# Patient Record
Sex: Male | Born: 1965 | Race: White | Hispanic: No | Marital: Married | Smoking: Never smoker
Health system: Southern US, Community
[De-identification: ages and names within clinical notes are randomized; demographics above are authoritative.]

## PROBLEM LIST (undated history)

## (undated) DIAGNOSIS — B159 Hepatitis A without hepatic coma: Secondary | ICD-10-CM

## (undated) DIAGNOSIS — M722 Plantar fascial fibromatosis: Secondary | ICD-10-CM

## (undated) HISTORY — PX: OTHER SURGICAL HISTORY: SHX169

## (undated) HISTORY — PX: SHOULDER SURGERY: SHX246

---

## 2008-03-25 ENCOUNTER — Encounter: Admission: RE | Admit: 2008-03-25 | Discharge: 2008-03-25 | Payer: Self-pay | Admitting: Internal Medicine

## 2008-11-25 ENCOUNTER — Encounter: Admission: RE | Admit: 2008-11-25 | Discharge: 2008-11-25 | Payer: Self-pay | Admitting: Chiropractor

## 2009-11-06 ENCOUNTER — Encounter: Admission: RE | Admit: 2009-11-06 | Discharge: 2009-11-06 | Payer: Self-pay | Admitting: Endocrinology

## 2011-09-09 ENCOUNTER — Other Ambulatory Visit: Payer: Self-pay | Admitting: Internal Medicine

## 2011-09-11 ENCOUNTER — Ambulatory Visit
Admission: RE | Admit: 2011-09-11 | Discharge: 2011-09-11 | Disposition: A | Discharge status: Home / Self Care | Payer: BC Managed Care – PPO | Source: Ambulatory Visit | Attending: Internal Medicine | Admitting: Internal Medicine

## 2012-03-20 ENCOUNTER — Ambulatory Visit: Payer: BC Managed Care – PPO | Attending: Neurological Surgery | Admitting: Physical Therapy

## 2012-03-20 DIAGNOSIS — H8110 Benign paroxysmal vertigo, unspecified ear: Secondary | ICD-10-CM | POA: Insufficient documentation

## 2012-03-20 DIAGNOSIS — IMO0001 Reserved for inherently not codable concepts without codable children: Secondary | ICD-10-CM | POA: Insufficient documentation

## 2016-09-16 ENCOUNTER — Ambulatory Visit: Payer: Self-pay | Admitting: Podiatry

## 2019-04-21 DIAGNOSIS — J069 Acute upper respiratory infection, unspecified: Secondary | ICD-10-CM | POA: Diagnosis not present

## 2019-05-04 DIAGNOSIS — M9902 Segmental and somatic dysfunction of thoracic region: Secondary | ICD-10-CM | POA: Diagnosis not present

## 2019-05-04 DIAGNOSIS — M9903 Segmental and somatic dysfunction of lumbar region: Secondary | ICD-10-CM | POA: Diagnosis not present

## 2019-05-04 DIAGNOSIS — M9901 Segmental and somatic dysfunction of cervical region: Secondary | ICD-10-CM | POA: Diagnosis not present

## 2019-05-04 DIAGNOSIS — M5386 Other specified dorsopathies, lumbar region: Secondary | ICD-10-CM | POA: Diagnosis not present

## 2019-05-19 DIAGNOSIS — M9901 Segmental and somatic dysfunction of cervical region: Secondary | ICD-10-CM | POA: Diagnosis not present

## 2019-05-19 DIAGNOSIS — M9902 Segmental and somatic dysfunction of thoracic region: Secondary | ICD-10-CM | POA: Diagnosis not present

## 2019-05-19 DIAGNOSIS — M5386 Other specified dorsopathies, lumbar region: Secondary | ICD-10-CM | POA: Diagnosis not present

## 2019-05-19 DIAGNOSIS — M9903 Segmental and somatic dysfunction of lumbar region: Secondary | ICD-10-CM | POA: Diagnosis not present

## 2019-05-25 DIAGNOSIS — M9901 Segmental and somatic dysfunction of cervical region: Secondary | ICD-10-CM | POA: Diagnosis not present

## 2019-05-25 DIAGNOSIS — M9903 Segmental and somatic dysfunction of lumbar region: Secondary | ICD-10-CM | POA: Diagnosis not present

## 2019-05-25 DIAGNOSIS — M9902 Segmental and somatic dysfunction of thoracic region: Secondary | ICD-10-CM | POA: Diagnosis not present

## 2019-05-25 DIAGNOSIS — M5386 Other specified dorsopathies, lumbar region: Secondary | ICD-10-CM | POA: Diagnosis not present

## 2019-06-08 DIAGNOSIS — M9902 Segmental and somatic dysfunction of thoracic region: Secondary | ICD-10-CM | POA: Diagnosis not present

## 2019-06-08 DIAGNOSIS — M9903 Segmental and somatic dysfunction of lumbar region: Secondary | ICD-10-CM | POA: Diagnosis not present

## 2019-06-08 DIAGNOSIS — M5386 Other specified dorsopathies, lumbar region: Secondary | ICD-10-CM | POA: Diagnosis not present

## 2019-06-08 DIAGNOSIS — M9901 Segmental and somatic dysfunction of cervical region: Secondary | ICD-10-CM | POA: Diagnosis not present

## 2019-07-12 DIAGNOSIS — M9903 Segmental and somatic dysfunction of lumbar region: Secondary | ICD-10-CM | POA: Diagnosis not present

## 2019-07-12 DIAGNOSIS — M5386 Other specified dorsopathies, lumbar region: Secondary | ICD-10-CM | POA: Diagnosis not present

## 2019-07-12 DIAGNOSIS — M9901 Segmental and somatic dysfunction of cervical region: Secondary | ICD-10-CM | POA: Diagnosis not present

## 2019-07-12 DIAGNOSIS — M9902 Segmental and somatic dysfunction of thoracic region: Secondary | ICD-10-CM | POA: Diagnosis not present

## 2019-07-19 DIAGNOSIS — M5386 Other specified dorsopathies, lumbar region: Secondary | ICD-10-CM | POA: Diagnosis not present

## 2019-07-19 DIAGNOSIS — M9901 Segmental and somatic dysfunction of cervical region: Secondary | ICD-10-CM | POA: Diagnosis not present

## 2019-07-19 DIAGNOSIS — M9903 Segmental and somatic dysfunction of lumbar region: Secondary | ICD-10-CM | POA: Diagnosis not present

## 2019-07-19 DIAGNOSIS — M9902 Segmental and somatic dysfunction of thoracic region: Secondary | ICD-10-CM | POA: Diagnosis not present

## 2019-08-02 DIAGNOSIS — M9903 Segmental and somatic dysfunction of lumbar region: Secondary | ICD-10-CM | POA: Diagnosis not present

## 2019-08-02 DIAGNOSIS — M5386 Other specified dorsopathies, lumbar region: Secondary | ICD-10-CM | POA: Diagnosis not present

## 2019-08-02 DIAGNOSIS — M9901 Segmental and somatic dysfunction of cervical region: Secondary | ICD-10-CM | POA: Diagnosis not present

## 2019-08-02 DIAGNOSIS — M9902 Segmental and somatic dysfunction of thoracic region: Secondary | ICD-10-CM | POA: Diagnosis not present

## 2019-08-16 DIAGNOSIS — M5386 Other specified dorsopathies, lumbar region: Secondary | ICD-10-CM | POA: Diagnosis not present

## 2019-08-16 DIAGNOSIS — M9903 Segmental and somatic dysfunction of lumbar region: Secondary | ICD-10-CM | POA: Diagnosis not present

## 2019-08-16 DIAGNOSIS — M9901 Segmental and somatic dysfunction of cervical region: Secondary | ICD-10-CM | POA: Diagnosis not present

## 2019-08-16 DIAGNOSIS — M9902 Segmental and somatic dysfunction of thoracic region: Secondary | ICD-10-CM | POA: Diagnosis not present

## 2019-08-18 DIAGNOSIS — M9902 Segmental and somatic dysfunction of thoracic region: Secondary | ICD-10-CM | POA: Diagnosis not present

## 2019-08-18 DIAGNOSIS — M9901 Segmental and somatic dysfunction of cervical region: Secondary | ICD-10-CM | POA: Diagnosis not present

## 2019-08-18 DIAGNOSIS — M9903 Segmental and somatic dysfunction of lumbar region: Secondary | ICD-10-CM | POA: Diagnosis not present

## 2019-08-18 DIAGNOSIS — M5386 Other specified dorsopathies, lumbar region: Secondary | ICD-10-CM | POA: Diagnosis not present

## 2019-08-23 DIAGNOSIS — M5386 Other specified dorsopathies, lumbar region: Secondary | ICD-10-CM | POA: Diagnosis not present

## 2019-08-23 DIAGNOSIS — M9902 Segmental and somatic dysfunction of thoracic region: Secondary | ICD-10-CM | POA: Diagnosis not present

## 2019-08-23 DIAGNOSIS — M9903 Segmental and somatic dysfunction of lumbar region: Secondary | ICD-10-CM | POA: Diagnosis not present

## 2019-08-23 DIAGNOSIS — M9901 Segmental and somatic dysfunction of cervical region: Secondary | ICD-10-CM | POA: Diagnosis not present

## 2019-08-25 DIAGNOSIS — M9903 Segmental and somatic dysfunction of lumbar region: Secondary | ICD-10-CM | POA: Diagnosis not present

## 2019-08-25 DIAGNOSIS — M5386 Other specified dorsopathies, lumbar region: Secondary | ICD-10-CM | POA: Diagnosis not present

## 2019-08-25 DIAGNOSIS — M9901 Segmental and somatic dysfunction of cervical region: Secondary | ICD-10-CM | POA: Diagnosis not present

## 2019-08-25 DIAGNOSIS — M9902 Segmental and somatic dysfunction of thoracic region: Secondary | ICD-10-CM | POA: Diagnosis not present

## 2019-08-30 DIAGNOSIS — M9902 Segmental and somatic dysfunction of thoracic region: Secondary | ICD-10-CM | POA: Diagnosis not present

## 2019-08-30 DIAGNOSIS — M5386 Other specified dorsopathies, lumbar region: Secondary | ICD-10-CM | POA: Diagnosis not present

## 2019-08-30 DIAGNOSIS — M9903 Segmental and somatic dysfunction of lumbar region: Secondary | ICD-10-CM | POA: Diagnosis not present

## 2019-08-30 DIAGNOSIS — M9901 Segmental and somatic dysfunction of cervical region: Secondary | ICD-10-CM | POA: Diagnosis not present

## 2019-09-01 DIAGNOSIS — M5386 Other specified dorsopathies, lumbar region: Secondary | ICD-10-CM | POA: Diagnosis not present

## 2019-09-01 DIAGNOSIS — M9901 Segmental and somatic dysfunction of cervical region: Secondary | ICD-10-CM | POA: Diagnosis not present

## 2019-09-01 DIAGNOSIS — M9902 Segmental and somatic dysfunction of thoracic region: Secondary | ICD-10-CM | POA: Diagnosis not present

## 2019-09-01 DIAGNOSIS — M9903 Segmental and somatic dysfunction of lumbar region: Secondary | ICD-10-CM | POA: Diagnosis not present

## 2019-09-06 DIAGNOSIS — M9903 Segmental and somatic dysfunction of lumbar region: Secondary | ICD-10-CM | POA: Diagnosis not present

## 2019-09-06 DIAGNOSIS — M9902 Segmental and somatic dysfunction of thoracic region: Secondary | ICD-10-CM | POA: Diagnosis not present

## 2019-09-06 DIAGNOSIS — M5386 Other specified dorsopathies, lumbar region: Secondary | ICD-10-CM | POA: Diagnosis not present

## 2019-09-06 DIAGNOSIS — M9901 Segmental and somatic dysfunction of cervical region: Secondary | ICD-10-CM | POA: Diagnosis not present

## 2019-09-08 DIAGNOSIS — M9901 Segmental and somatic dysfunction of cervical region: Secondary | ICD-10-CM | POA: Diagnosis not present

## 2019-09-08 DIAGNOSIS — M9902 Segmental and somatic dysfunction of thoracic region: Secondary | ICD-10-CM | POA: Diagnosis not present

## 2019-09-08 DIAGNOSIS — M5386 Other specified dorsopathies, lumbar region: Secondary | ICD-10-CM | POA: Diagnosis not present

## 2019-09-08 DIAGNOSIS — M9903 Segmental and somatic dysfunction of lumbar region: Secondary | ICD-10-CM | POA: Diagnosis not present

## 2019-09-13 DIAGNOSIS — M9902 Segmental and somatic dysfunction of thoracic region: Secondary | ICD-10-CM | POA: Diagnosis not present

## 2019-09-13 DIAGNOSIS — M9901 Segmental and somatic dysfunction of cervical region: Secondary | ICD-10-CM | POA: Diagnosis not present

## 2019-09-13 DIAGNOSIS — M5386 Other specified dorsopathies, lumbar region: Secondary | ICD-10-CM | POA: Diagnosis not present

## 2019-09-13 DIAGNOSIS — M9903 Segmental and somatic dysfunction of lumbar region: Secondary | ICD-10-CM | POA: Diagnosis not present

## 2019-09-15 DIAGNOSIS — M9901 Segmental and somatic dysfunction of cervical region: Secondary | ICD-10-CM | POA: Diagnosis not present

## 2019-09-15 DIAGNOSIS — M5386 Other specified dorsopathies, lumbar region: Secondary | ICD-10-CM | POA: Diagnosis not present

## 2019-09-15 DIAGNOSIS — M9902 Segmental and somatic dysfunction of thoracic region: Secondary | ICD-10-CM | POA: Diagnosis not present

## 2019-09-15 DIAGNOSIS — M9903 Segmental and somatic dysfunction of lumbar region: Secondary | ICD-10-CM | POA: Diagnosis not present

## 2019-09-22 DIAGNOSIS — M9902 Segmental and somatic dysfunction of thoracic region: Secondary | ICD-10-CM | POA: Diagnosis not present

## 2019-09-22 DIAGNOSIS — M9903 Segmental and somatic dysfunction of lumbar region: Secondary | ICD-10-CM | POA: Diagnosis not present

## 2019-09-22 DIAGNOSIS — M5386 Other specified dorsopathies, lumbar region: Secondary | ICD-10-CM | POA: Diagnosis not present

## 2019-09-22 DIAGNOSIS — M9901 Segmental and somatic dysfunction of cervical region: Secondary | ICD-10-CM | POA: Diagnosis not present

## 2019-09-29 DIAGNOSIS — M9902 Segmental and somatic dysfunction of thoracic region: Secondary | ICD-10-CM | POA: Diagnosis not present

## 2019-09-29 DIAGNOSIS — M9903 Segmental and somatic dysfunction of lumbar region: Secondary | ICD-10-CM | POA: Diagnosis not present

## 2019-09-29 DIAGNOSIS — M5386 Other specified dorsopathies, lumbar region: Secondary | ICD-10-CM | POA: Diagnosis not present

## 2019-09-29 DIAGNOSIS — M9901 Segmental and somatic dysfunction of cervical region: Secondary | ICD-10-CM | POA: Diagnosis not present

## 2019-10-06 DIAGNOSIS — M9901 Segmental and somatic dysfunction of cervical region: Secondary | ICD-10-CM | POA: Diagnosis not present

## 2019-10-06 DIAGNOSIS — M9902 Segmental and somatic dysfunction of thoracic region: Secondary | ICD-10-CM | POA: Diagnosis not present

## 2019-10-06 DIAGNOSIS — M9903 Segmental and somatic dysfunction of lumbar region: Secondary | ICD-10-CM | POA: Diagnosis not present

## 2019-10-06 DIAGNOSIS — M5386 Other specified dorsopathies, lumbar region: Secondary | ICD-10-CM | POA: Diagnosis not present

## 2019-10-13 DIAGNOSIS — M9901 Segmental and somatic dysfunction of cervical region: Secondary | ICD-10-CM | POA: Diagnosis not present

## 2019-10-13 DIAGNOSIS — M9902 Segmental and somatic dysfunction of thoracic region: Secondary | ICD-10-CM | POA: Diagnosis not present

## 2019-10-13 DIAGNOSIS — M9903 Segmental and somatic dysfunction of lumbar region: Secondary | ICD-10-CM | POA: Diagnosis not present

## 2019-10-13 DIAGNOSIS — M5386 Other specified dorsopathies, lumbar region: Secondary | ICD-10-CM | POA: Diagnosis not present

## 2019-10-20 DIAGNOSIS — M9903 Segmental and somatic dysfunction of lumbar region: Secondary | ICD-10-CM | POA: Diagnosis not present

## 2019-10-20 DIAGNOSIS — M9901 Segmental and somatic dysfunction of cervical region: Secondary | ICD-10-CM | POA: Diagnosis not present

## 2019-10-20 DIAGNOSIS — M9902 Segmental and somatic dysfunction of thoracic region: Secondary | ICD-10-CM | POA: Diagnosis not present

## 2019-10-20 DIAGNOSIS — M5386 Other specified dorsopathies, lumbar region: Secondary | ICD-10-CM | POA: Diagnosis not present

## 2019-10-27 DIAGNOSIS — M9902 Segmental and somatic dysfunction of thoracic region: Secondary | ICD-10-CM | POA: Diagnosis not present

## 2019-10-27 DIAGNOSIS — M5386 Other specified dorsopathies, lumbar region: Secondary | ICD-10-CM | POA: Diagnosis not present

## 2019-10-27 DIAGNOSIS — M9903 Segmental and somatic dysfunction of lumbar region: Secondary | ICD-10-CM | POA: Diagnosis not present

## 2019-10-27 DIAGNOSIS — M9901 Segmental and somatic dysfunction of cervical region: Secondary | ICD-10-CM | POA: Diagnosis not present

## 2019-11-17 DIAGNOSIS — M9903 Segmental and somatic dysfunction of lumbar region: Secondary | ICD-10-CM | POA: Diagnosis not present

## 2019-11-17 DIAGNOSIS — M9902 Segmental and somatic dysfunction of thoracic region: Secondary | ICD-10-CM | POA: Diagnosis not present

## 2019-11-17 DIAGNOSIS — M5386 Other specified dorsopathies, lumbar region: Secondary | ICD-10-CM | POA: Diagnosis not present

## 2019-11-17 DIAGNOSIS — M9901 Segmental and somatic dysfunction of cervical region: Secondary | ICD-10-CM | POA: Diagnosis not present

## 2019-11-24 DIAGNOSIS — M5386 Other specified dorsopathies, lumbar region: Secondary | ICD-10-CM | POA: Diagnosis not present

## 2019-11-24 DIAGNOSIS — M9901 Segmental and somatic dysfunction of cervical region: Secondary | ICD-10-CM | POA: Diagnosis not present

## 2019-11-24 DIAGNOSIS — M9902 Segmental and somatic dysfunction of thoracic region: Secondary | ICD-10-CM | POA: Diagnosis not present

## 2019-11-24 DIAGNOSIS — M9903 Segmental and somatic dysfunction of lumbar region: Secondary | ICD-10-CM | POA: Diagnosis not present

## 2019-12-01 DIAGNOSIS — M9902 Segmental and somatic dysfunction of thoracic region: Secondary | ICD-10-CM | POA: Diagnosis not present

## 2019-12-01 DIAGNOSIS — M9903 Segmental and somatic dysfunction of lumbar region: Secondary | ICD-10-CM | POA: Diagnosis not present

## 2019-12-01 DIAGNOSIS — M9901 Segmental and somatic dysfunction of cervical region: Secondary | ICD-10-CM | POA: Diagnosis not present

## 2019-12-01 DIAGNOSIS — M5386 Other specified dorsopathies, lumbar region: Secondary | ICD-10-CM | POA: Diagnosis not present

## 2019-12-13 DIAGNOSIS — M9901 Segmental and somatic dysfunction of cervical region: Secondary | ICD-10-CM | POA: Diagnosis not present

## 2019-12-13 DIAGNOSIS — M9902 Segmental and somatic dysfunction of thoracic region: Secondary | ICD-10-CM | POA: Diagnosis not present

## 2019-12-13 DIAGNOSIS — M9903 Segmental and somatic dysfunction of lumbar region: Secondary | ICD-10-CM | POA: Diagnosis not present

## 2019-12-13 DIAGNOSIS — M5386 Other specified dorsopathies, lumbar region: Secondary | ICD-10-CM | POA: Diagnosis not present

## 2019-12-27 DIAGNOSIS — M9902 Segmental and somatic dysfunction of thoracic region: Secondary | ICD-10-CM | POA: Diagnosis not present

## 2019-12-27 DIAGNOSIS — M9901 Segmental and somatic dysfunction of cervical region: Secondary | ICD-10-CM | POA: Diagnosis not present

## 2019-12-27 DIAGNOSIS — M5386 Other specified dorsopathies, lumbar region: Secondary | ICD-10-CM | POA: Diagnosis not present

## 2019-12-27 DIAGNOSIS — M9903 Segmental and somatic dysfunction of lumbar region: Secondary | ICD-10-CM | POA: Diagnosis not present

## 2019-12-30 DIAGNOSIS — Z0000 Encounter for general adult medical examination without abnormal findings: Secondary | ICD-10-CM | POA: Diagnosis not present

## 2019-12-30 DIAGNOSIS — Z8052 Family history of malignant neoplasm of bladder: Secondary | ICD-10-CM | POA: Diagnosis not present

## 2020-01-04 DIAGNOSIS — E291 Testicular hypofunction: Secondary | ICD-10-CM | POA: Diagnosis not present

## 2020-01-04 DIAGNOSIS — E7800 Pure hypercholesterolemia, unspecified: Secondary | ICD-10-CM | POA: Diagnosis not present

## 2020-01-04 DIAGNOSIS — N529 Male erectile dysfunction, unspecified: Secondary | ICD-10-CM | POA: Diagnosis not present

## 2020-01-04 DIAGNOSIS — M2540 Effusion, unspecified joint: Secondary | ICD-10-CM | POA: Diagnosis not present

## 2020-01-04 DIAGNOSIS — E559 Vitamin D deficiency, unspecified: Secondary | ICD-10-CM | POA: Diagnosis not present

## 2020-01-04 DIAGNOSIS — Z23 Encounter for immunization: Secondary | ICD-10-CM | POA: Diagnosis not present

## 2020-01-11 DIAGNOSIS — M5386 Other specified dorsopathies, lumbar region: Secondary | ICD-10-CM | POA: Diagnosis not present

## 2020-01-11 DIAGNOSIS — M9903 Segmental and somatic dysfunction of lumbar region: Secondary | ICD-10-CM | POA: Diagnosis not present

## 2020-01-11 DIAGNOSIS — M9902 Segmental and somatic dysfunction of thoracic region: Secondary | ICD-10-CM | POA: Diagnosis not present

## 2020-01-11 DIAGNOSIS — M9901 Segmental and somatic dysfunction of cervical region: Secondary | ICD-10-CM | POA: Diagnosis not present

## 2020-02-02 DIAGNOSIS — M5386 Other specified dorsopathies, lumbar region: Secondary | ICD-10-CM | POA: Diagnosis not present

## 2020-02-02 DIAGNOSIS — M9902 Segmental and somatic dysfunction of thoracic region: Secondary | ICD-10-CM | POA: Diagnosis not present

## 2020-02-02 DIAGNOSIS — M9903 Segmental and somatic dysfunction of lumbar region: Secondary | ICD-10-CM | POA: Diagnosis not present

## 2020-02-02 DIAGNOSIS — M9901 Segmental and somatic dysfunction of cervical region: Secondary | ICD-10-CM | POA: Diagnosis not present

## 2020-02-09 DIAGNOSIS — M5386 Other specified dorsopathies, lumbar region: Secondary | ICD-10-CM | POA: Diagnosis not present

## 2020-02-09 DIAGNOSIS — M9903 Segmental and somatic dysfunction of lumbar region: Secondary | ICD-10-CM | POA: Diagnosis not present

## 2020-02-09 DIAGNOSIS — M9901 Segmental and somatic dysfunction of cervical region: Secondary | ICD-10-CM | POA: Diagnosis not present

## 2020-02-09 DIAGNOSIS — M9902 Segmental and somatic dysfunction of thoracic region: Secondary | ICD-10-CM | POA: Diagnosis not present

## 2020-05-24 DIAGNOSIS — M9901 Segmental and somatic dysfunction of cervical region: Secondary | ICD-10-CM | POA: Diagnosis not present

## 2020-05-24 DIAGNOSIS — R519 Headache, unspecified: Secondary | ICD-10-CM | POA: Diagnosis not present

## 2020-05-24 DIAGNOSIS — M531 Cervicobrachial syndrome: Secondary | ICD-10-CM | POA: Diagnosis not present

## 2020-05-24 DIAGNOSIS — M5386 Other specified dorsopathies, lumbar region: Secondary | ICD-10-CM | POA: Diagnosis not present

## 2020-05-31 DIAGNOSIS — M9901 Segmental and somatic dysfunction of cervical region: Secondary | ICD-10-CM | POA: Diagnosis not present

## 2020-05-31 DIAGNOSIS — M531 Cervicobrachial syndrome: Secondary | ICD-10-CM | POA: Diagnosis not present

## 2020-05-31 DIAGNOSIS — R519 Headache, unspecified: Secondary | ICD-10-CM | POA: Diagnosis not present

## 2020-05-31 DIAGNOSIS — M5386 Other specified dorsopathies, lumbar region: Secondary | ICD-10-CM | POA: Diagnosis not present

## 2020-06-07 DIAGNOSIS — R519 Headache, unspecified: Secondary | ICD-10-CM | POA: Diagnosis not present

## 2020-06-07 DIAGNOSIS — M9901 Segmental and somatic dysfunction of cervical region: Secondary | ICD-10-CM | POA: Diagnosis not present

## 2020-06-07 DIAGNOSIS — M531 Cervicobrachial syndrome: Secondary | ICD-10-CM | POA: Diagnosis not present

## 2020-06-07 DIAGNOSIS — M5386 Other specified dorsopathies, lumbar region: Secondary | ICD-10-CM | POA: Diagnosis not present

## 2020-06-14 DIAGNOSIS — M9901 Segmental and somatic dysfunction of cervical region: Secondary | ICD-10-CM | POA: Diagnosis not present

## 2020-06-14 DIAGNOSIS — M5386 Other specified dorsopathies, lumbar region: Secondary | ICD-10-CM | POA: Diagnosis not present

## 2020-06-14 DIAGNOSIS — R519 Headache, unspecified: Secondary | ICD-10-CM | POA: Diagnosis not present

## 2020-06-14 DIAGNOSIS — M531 Cervicobrachial syndrome: Secondary | ICD-10-CM | POA: Diagnosis not present

## 2020-06-28 DIAGNOSIS — R519 Headache, unspecified: Secondary | ICD-10-CM | POA: Diagnosis not present

## 2020-06-28 DIAGNOSIS — M5386 Other specified dorsopathies, lumbar region: Secondary | ICD-10-CM | POA: Diagnosis not present

## 2020-06-28 DIAGNOSIS — M531 Cervicobrachial syndrome: Secondary | ICD-10-CM | POA: Diagnosis not present

## 2020-06-28 DIAGNOSIS — M9901 Segmental and somatic dysfunction of cervical region: Secondary | ICD-10-CM | POA: Diagnosis not present

## 2020-07-12 DIAGNOSIS — M9901 Segmental and somatic dysfunction of cervical region: Secondary | ICD-10-CM | POA: Diagnosis not present

## 2020-07-12 DIAGNOSIS — R519 Headache, unspecified: Secondary | ICD-10-CM | POA: Diagnosis not present

## 2020-07-12 DIAGNOSIS — M5386 Other specified dorsopathies, lumbar region: Secondary | ICD-10-CM | POA: Diagnosis not present

## 2020-07-12 DIAGNOSIS — M531 Cervicobrachial syndrome: Secondary | ICD-10-CM | POA: Diagnosis not present

## 2020-07-26 DIAGNOSIS — M5386 Other specified dorsopathies, lumbar region: Secondary | ICD-10-CM | POA: Diagnosis not present

## 2020-07-26 DIAGNOSIS — M531 Cervicobrachial syndrome: Secondary | ICD-10-CM | POA: Diagnosis not present

## 2020-07-26 DIAGNOSIS — R519 Headache, unspecified: Secondary | ICD-10-CM | POA: Diagnosis not present

## 2020-07-26 DIAGNOSIS — M9901 Segmental and somatic dysfunction of cervical region: Secondary | ICD-10-CM | POA: Diagnosis not present

## 2020-08-15 DIAGNOSIS — M531 Cervicobrachial syndrome: Secondary | ICD-10-CM | POA: Diagnosis not present

## 2020-08-15 DIAGNOSIS — R519 Headache, unspecified: Secondary | ICD-10-CM | POA: Diagnosis not present

## 2020-08-15 DIAGNOSIS — M5386 Other specified dorsopathies, lumbar region: Secondary | ICD-10-CM | POA: Diagnosis not present

## 2020-08-15 DIAGNOSIS — M9901 Segmental and somatic dysfunction of cervical region: Secondary | ICD-10-CM | POA: Diagnosis not present

## 2020-08-30 DIAGNOSIS — M9901 Segmental and somatic dysfunction of cervical region: Secondary | ICD-10-CM | POA: Diagnosis not present

## 2020-08-30 DIAGNOSIS — R519 Headache, unspecified: Secondary | ICD-10-CM | POA: Diagnosis not present

## 2020-08-30 DIAGNOSIS — M5386 Other specified dorsopathies, lumbar region: Secondary | ICD-10-CM | POA: Diagnosis not present

## 2020-08-30 DIAGNOSIS — M531 Cervicobrachial syndrome: Secondary | ICD-10-CM | POA: Diagnosis not present

## 2020-09-20 DIAGNOSIS — M62830 Muscle spasm of back: Secondary | ICD-10-CM | POA: Diagnosis not present

## 2020-09-20 DIAGNOSIS — M9903 Segmental and somatic dysfunction of lumbar region: Secondary | ICD-10-CM | POA: Diagnosis not present

## 2020-09-20 DIAGNOSIS — M9901 Segmental and somatic dysfunction of cervical region: Secondary | ICD-10-CM | POA: Diagnosis not present

## 2020-09-20 DIAGNOSIS — M545 Low back pain, unspecified: Secondary | ICD-10-CM | POA: Diagnosis not present

## 2020-09-26 DIAGNOSIS — M9901 Segmental and somatic dysfunction of cervical region: Secondary | ICD-10-CM | POA: Diagnosis not present

## 2020-09-26 DIAGNOSIS — M9903 Segmental and somatic dysfunction of lumbar region: Secondary | ICD-10-CM | POA: Diagnosis not present

## 2020-09-26 DIAGNOSIS — M62830 Muscle spasm of back: Secondary | ICD-10-CM | POA: Diagnosis not present

## 2020-09-26 DIAGNOSIS — M545 Low back pain, unspecified: Secondary | ICD-10-CM | POA: Diagnosis not present

## 2020-10-05 DIAGNOSIS — M62830 Muscle spasm of back: Secondary | ICD-10-CM | POA: Diagnosis not present

## 2020-10-05 DIAGNOSIS — M9903 Segmental and somatic dysfunction of lumbar region: Secondary | ICD-10-CM | POA: Diagnosis not present

## 2020-10-05 DIAGNOSIS — M545 Low back pain, unspecified: Secondary | ICD-10-CM | POA: Diagnosis not present

## 2020-10-05 DIAGNOSIS — M9901 Segmental and somatic dysfunction of cervical region: Secondary | ICD-10-CM | POA: Diagnosis not present

## 2020-10-09 DIAGNOSIS — M9903 Segmental and somatic dysfunction of lumbar region: Secondary | ICD-10-CM | POA: Diagnosis not present

## 2020-10-09 DIAGNOSIS — M9901 Segmental and somatic dysfunction of cervical region: Secondary | ICD-10-CM | POA: Diagnosis not present

## 2020-10-09 DIAGNOSIS — M62830 Muscle spasm of back: Secondary | ICD-10-CM | POA: Diagnosis not present

## 2020-10-09 DIAGNOSIS — M545 Low back pain, unspecified: Secondary | ICD-10-CM | POA: Diagnosis not present

## 2020-10-11 DIAGNOSIS — M9901 Segmental and somatic dysfunction of cervical region: Secondary | ICD-10-CM | POA: Diagnosis not present

## 2020-10-11 DIAGNOSIS — M62830 Muscle spasm of back: Secondary | ICD-10-CM | POA: Diagnosis not present

## 2020-10-11 DIAGNOSIS — M9903 Segmental and somatic dysfunction of lumbar region: Secondary | ICD-10-CM | POA: Diagnosis not present

## 2020-10-11 DIAGNOSIS — M545 Low back pain, unspecified: Secondary | ICD-10-CM | POA: Diagnosis not present

## 2020-10-16 DIAGNOSIS — M9901 Segmental and somatic dysfunction of cervical region: Secondary | ICD-10-CM | POA: Diagnosis not present

## 2020-10-16 DIAGNOSIS — M62830 Muscle spasm of back: Secondary | ICD-10-CM | POA: Diagnosis not present

## 2020-10-16 DIAGNOSIS — M9903 Segmental and somatic dysfunction of lumbar region: Secondary | ICD-10-CM | POA: Diagnosis not present

## 2020-10-16 DIAGNOSIS — M545 Low back pain, unspecified: Secondary | ICD-10-CM | POA: Diagnosis not present

## 2020-10-18 DIAGNOSIS — M9903 Segmental and somatic dysfunction of lumbar region: Secondary | ICD-10-CM | POA: Diagnosis not present

## 2020-10-18 DIAGNOSIS — M62830 Muscle spasm of back: Secondary | ICD-10-CM | POA: Diagnosis not present

## 2020-10-18 DIAGNOSIS — M9901 Segmental and somatic dysfunction of cervical region: Secondary | ICD-10-CM | POA: Diagnosis not present

## 2020-10-18 DIAGNOSIS — M545 Low back pain, unspecified: Secondary | ICD-10-CM | POA: Diagnosis not present

## 2020-10-19 DIAGNOSIS — M9901 Segmental and somatic dysfunction of cervical region: Secondary | ICD-10-CM | POA: Diagnosis not present

## 2020-10-19 DIAGNOSIS — M545 Low back pain, unspecified: Secondary | ICD-10-CM | POA: Diagnosis not present

## 2020-10-19 DIAGNOSIS — M62830 Muscle spasm of back: Secondary | ICD-10-CM | POA: Diagnosis not present

## 2020-10-19 DIAGNOSIS — M9903 Segmental and somatic dysfunction of lumbar region: Secondary | ICD-10-CM | POA: Diagnosis not present

## 2020-10-23 DIAGNOSIS — M9901 Segmental and somatic dysfunction of cervical region: Secondary | ICD-10-CM | POA: Diagnosis not present

## 2020-10-23 DIAGNOSIS — M62830 Muscle spasm of back: Secondary | ICD-10-CM | POA: Diagnosis not present

## 2020-10-23 DIAGNOSIS — M545 Low back pain, unspecified: Secondary | ICD-10-CM | POA: Diagnosis not present

## 2020-10-23 DIAGNOSIS — M9903 Segmental and somatic dysfunction of lumbar region: Secondary | ICD-10-CM | POA: Diagnosis not present

## 2020-10-25 DIAGNOSIS — M9901 Segmental and somatic dysfunction of cervical region: Secondary | ICD-10-CM | POA: Diagnosis not present

## 2020-10-25 DIAGNOSIS — M62838 Other muscle spasm: Secondary | ICD-10-CM | POA: Diagnosis not present

## 2020-10-25 DIAGNOSIS — M9903 Segmental and somatic dysfunction of lumbar region: Secondary | ICD-10-CM | POA: Diagnosis not present

## 2020-10-25 DIAGNOSIS — M62830 Muscle spasm of back: Secondary | ICD-10-CM | POA: Diagnosis not present

## 2020-10-25 DIAGNOSIS — M545 Low back pain, unspecified: Secondary | ICD-10-CM | POA: Diagnosis not present

## 2021-03-29 DIAGNOSIS — H66003 Acute suppurative otitis media without spontaneous rupture of ear drum, bilateral: Secondary | ICD-10-CM | POA: Diagnosis not present

## 2021-04-05 DIAGNOSIS — H6506 Acute serous otitis media, recurrent, bilateral: Secondary | ICD-10-CM | POA: Diagnosis not present

## 2021-05-03 ENCOUNTER — Other Ambulatory Visit: Payer: Self-pay

## 2021-05-03 ENCOUNTER — Emergency Department (HOSPITAL_COMMUNITY): Payer: BC Managed Care – PPO

## 2021-05-03 ENCOUNTER — Encounter (HOSPITAL_COMMUNITY): Payer: Self-pay

## 2021-05-03 ENCOUNTER — Emergency Department (HOSPITAL_COMMUNITY)
Admission: EM | Admit: 2021-05-03 | Discharge: 2021-05-03 | Disposition: A | Discharge status: Home / Self Care | Payer: BC Managed Care – PPO | Attending: Emergency Medicine | Admitting: Emergency Medicine

## 2021-05-03 DIAGNOSIS — R531 Weakness: Secondary | ICD-10-CM | POA: Diagnosis not present

## 2021-05-03 DIAGNOSIS — R000 Tachycardia, unspecified: Secondary | ICD-10-CM | POA: Diagnosis not present

## 2021-05-03 DIAGNOSIS — R930 Abnormal findings on diagnostic imaging of skull and head, not elsewhere classified: Secondary | ICD-10-CM

## 2021-05-03 DIAGNOSIS — M5090 Cervical disc disorder, unspecified, unspecified cervical region: Secondary | ICD-10-CM

## 2021-05-03 DIAGNOSIS — R202 Paresthesia of skin: Secondary | ICD-10-CM | POA: Insufficient documentation

## 2021-05-03 DIAGNOSIS — R42 Dizziness and giddiness: Secondary | ICD-10-CM | POA: Diagnosis not present

## 2021-05-03 DIAGNOSIS — I6782 Cerebral ischemia: Secondary | ICD-10-CM | POA: Diagnosis not present

## 2021-05-03 DIAGNOSIS — M4312 Spondylolisthesis, cervical region: Secondary | ICD-10-CM | POA: Diagnosis not present

## 2021-05-03 DIAGNOSIS — M40204 Unspecified kyphosis, thoracic region: Secondary | ICD-10-CM | POA: Diagnosis not present

## 2021-05-03 DIAGNOSIS — I629 Nontraumatic intracranial hemorrhage, unspecified: Secondary | ICD-10-CM | POA: Diagnosis not present

## 2021-05-03 HISTORY — DX: Plantar fascial fibromatosis: M72.2

## 2021-05-03 HISTORY — DX: Hepatitis a without hepatic coma: B15.9

## 2021-05-03 LAB — CBC WITH DIFFERENTIAL/PLATELET
Abs Immature Granulocytes: 0.04 10*3/uL (ref 0.00–0.07)
Basophils Absolute: 0.1 10*3/uL (ref 0.0–0.1)
Basophils Relative: 1 %
Eosinophils Absolute: 0.1 10*3/uL (ref 0.0–0.5)
Eosinophils Relative: 1 %
HCT: 50.2 % (ref 39.0–52.0)
Hemoglobin: 17.7 g/dL — ABNORMAL HIGH (ref 13.0–17.0)
Immature Granulocytes: 0 %
Lymphocytes Relative: 16 %
Lymphs Abs: 1.6 10*3/uL (ref 0.7–4.0)
MCH: 32.1 pg (ref 26.0–34.0)
MCHC: 35.3 g/dL (ref 30.0–36.0)
MCV: 91.1 fL (ref 80.0–100.0)
Monocytes Absolute: 0.7 10*3/uL (ref 0.1–1.0)
Monocytes Relative: 7 %
Neutro Abs: 7.3 10*3/uL (ref 1.7–7.7)
Neutrophils Relative %: 75 %
Platelets: 229 10*3/uL (ref 150–400)
RBC: 5.51 MIL/uL (ref 4.22–5.81)
RDW: 12.7 % (ref 11.5–15.5)
WBC: 9.7 10*3/uL (ref 4.0–10.5)
nRBC: 0 % (ref 0.0–0.2)

## 2021-05-03 LAB — COMPREHENSIVE METABOLIC PANEL
ALT: 29 U/L (ref 0–44)
AST: 26 U/L (ref 15–41)
Albumin: 4.2 g/dL (ref 3.5–5.0)
Alkaline Phosphatase: 51 U/L (ref 38–126)
Anion gap: 13 (ref 5–15)
BUN: 10 mg/dL (ref 6–20)
CO2: 21 mmol/L — ABNORMAL LOW (ref 22–32)
Calcium: 9.2 mg/dL (ref 8.9–10.3)
Chloride: 102 mmol/L (ref 98–111)
Creatinine, Ser: 1.17 mg/dL (ref 0.61–1.24)
GFR, Estimated: >60 mL/min (ref >60)
Glucose, Bld: 132 mg/dL — ABNORMAL HIGH (ref 70–99)
Potassium: 4.1 mmol/L (ref 3.5–5.1)
Sodium: 136 mmol/L (ref 135–145)
Total Bilirubin: 1 mg/dL (ref 0.3–1.2)
Total Protein: 7.2 g/dL (ref 6.5–8.1)

## 2021-05-03 LAB — URINALYSIS, ROUTINE W REFLEX MICROSCOPIC
Bilirubin Urine: NEGATIVE
Glucose, UA: NEGATIVE mg/dL
Hgb urine dipstick: NEGATIVE
Ketones, ur: NEGATIVE mg/dL
Leukocytes,Ua: NEGATIVE
Nitrite: NEGATIVE
Protein, ur: NEGATIVE mg/dL
Specific Gravity, Urine: 1.018 (ref 1.005–1.030)
pH: 5 (ref 5.0–8.0)

## 2021-05-03 LAB — APTT: aPTT: 27 seconds (ref 24–36)

## 2021-05-03 LAB — PROTIME-INR
INR: 0.9 (ref 0.8–1.2)
Prothrombin Time: 12.4 seconds (ref 11.4–15.2)

## 2021-05-03 NOTE — ED Provider Triage Note (Signed)
Emergency Medicine Provider Triage Evaluation Note ? ?Adam Levine , a 56 y.o. male  was evaluated in triage.  Pt complains of weakness to his right hand. He reports he was sitting at a coffee shop when he found it difficult to hold the coffee cup. He reports he felt lightheaded and then he noticed the weakness in his left arm as well. Denies any numbness or tingling. Denies any headache or blurry vision. He reports a history of syncopal episode in the past. Denies any chest pain or SOB. Denies any trouble with his speech or walking. The episdoe went from 1245 to 1330. No feels at baseline. Her reports he took two 325mg  of ASA. ? ?Review of Systems  ?Positive:  ?Negative:  ? ?Physical Exam  ?BP (!) 167/87 (BP Location: Right Arm)   Pulse (!) 112   Temp 98.8 ?F (37.1 ?C) (Oral)   Resp 16   SpO2 95%  ?Gen:   Awake, no distress   ?Resp:  Normal effort  ?MSK:   Moves extremities without difficulty  ?Other:  Cranial nerves II-XII. Finger to nose intact. Normal speech answering questions appropriately. Strength 5/5 in upper and lower extremities bilaterally.  ? ?Medical Decision Making  ?Medically screening exam initiated at 2:51 PM.  Appropriate orders placed.  Chet Chaddock was informed that the remainder of the evaluation will be completed by another provider, this initial triage assessment does not replace that evaluation, and the importance of remaining in the ED until their evaluation is complete. ? ?Will order head CT and basic labs.  ?  ?Cristal Deer, PA-C ?05/03/21 1457 ? ?

## 2021-05-03 NOTE — ED Provider Notes (Signed)
?[facility] ?Provider Note ? ? ?CSN: [id number] ?Arrival date & time: 05/03/21  1340 ? ?  ? ?History ? ?Chief Complaint  ?Patient presents with  ? weakness in hands  ? ? ?Adam Levine is a 56 y.o. male. ? ?56 year old male presents with cute onset of bilateral hand paresthesias and weakness which began prior to arrival.  Symptoms lasted for approximately 40 minutes have since resolved.  Slight dizziness but no severe headache.  No visual changes.  No gait disturbance.  No change in his speech.  Denies any confusion.  No prior history of same.  No treatment use prior to arrival ? ? ?  ? ?Home Medications ?Prior to Admission medications   ?Not on File  ?   ? ?Allergies    ?Patient has no known allergies.   ? ?Review of Systems   ?Review of Systems  ?All other systems reviewed and are negative. ? ?Physical Exam ?Updated Vital Signs ?BP (!) 172/84   Pulse 99   Temp 98.8 ?F (37.1 ?C) (Oral)   Resp 16   SpO2 95%  ?Physical Exam ?Vitals and nursing note reviewed.  ?Constitutional:   ?   General: He is not in acute distress. ?   Appearance: Normal appearance. He is well-developed. He is not toxic-appearing.  ?HENT:  ?   Head: Normocephalic and atraumatic.  ?Eyes:  ?   General: Lids are normal.  ?   Conjunctiva/sclera: Conjunctivae normal.  ?   Pupils: Pupils are equal, round, and reactive to light.  ?Neck:  ?   Thyroid: No thyroid mass.  ?   Trachea: No tracheal deviation.  ?Cardiovascular:  ?   Rate and Rhythm: Normal rate and regular rhythm.  ?   Heart sounds: Normal heart sounds. No murmur heard. ?  No gallop.  ?Pulmonary:  ?   Effort: Pulmonary effort is normal. No respiratory distress.  ?   Breath sounds: Normal breath sounds. No stridor. No decreased breath sounds, wheezing, rhonchi or rales.  ?Abdominal:  ?   General: There is no distension.  ?   Palpations: Abdomen is soft.  ?   Tenderness: There is no abdominal tenderness. There is no rebound.  ?Musculoskeletal:      ?   General: No tenderness. Normal range of motion.  ?   Cervical back: Normal range of motion and neck supple.  ?Skin: ?   General: Skin is warm and dry.  ?   Findings: No abrasion or rash.  ?Neurological:  ?   General: No focal deficit present.  ?   Mental Status: He is alert and oriented to person, place, and time. Mental status is at baseline.  ?   GCS: GCS eye subscore is 4. GCS verbal subscore is 5. GCS motor subscore is 6.  ?   Cranial Nerves: No cranial nerve deficit.  ?   Sensory: No sensory deficit.  ?   Motor: Motor function is intact.  ?   Comments: Patient neurovascular intact at both hands  ?Psychiatric:     ?   Attention and Perception: Attention normal.     ?   Speech: Speech normal.     ?   Behavior: Behavior normal.  ? ? ?ED Results / Procedures / Treatments   ?Labs ?(all labs ordered are listed, but only abnormal results are displayed) ?Labs Reviewed  ?CBC WITH DIFFERENTIAL/PLATELET - Abnormal; Notable for the following components:  ?    Result Value  ? Hemoglobin 17.7 (*)   ?  All other components within normal limits  ?COMPREHENSIVE METABOLIC PANEL - Abnormal; Notable for the following components:  ? CO2 21 (*)   ? Glucose, Bld 132 (*)   ? All other components within normal limits  ?URINALYSIS, ROUTINE W REFLEX MICROSCOPIC  ?APTT  ?PROTIME-INR  ? ? ?EKG ?EKG Interpretation ? ?Date/Time:  Thursday May 03 2021 14:19:14 EDT ?Ventricular Rate:  118 ?PR Interval:  122 ?QRS Duration: 88 ?QT Interval:  304 ?QTC Calculation: 426 ?R Axis:   74 ?Text Interpretation: Sinus tachycardia Otherwise normal ECG No previous ECGs available Confirmed by Lorre Nick ([id number]) on 05/03/2021 7:01:29 PM ? ?Radiology ?CT Head Wo Contrast ? ?Result Date: 05/03/2021 ?CLINICAL DATA:  Lightheadedness and weakness in right hand. EXAM: CT HEAD WITHOUT CONTRAST CT CERVICAL SPINE WITHOUT CONTRAST TECHNIQUE: Multidetector CT imaging of the head and cervical spine was performed following the standard protocol without intravenous  contrast. Multiplanar CT image reconstructions of the cervical spine were also generated. RADIATION DOSE REDUCTION: This exam was performed according to the departmental dose-optimization program which includes automated exposure control, adjustment of the Adam and/or kV according to patient size and/or use of iterative reconstruction technique. COMPARISON:  Cervical spine radiographs on 05/11/2012 FINDINGS: CT HEAD FINDINGS Brain: The brain demonstrates no evidence of hemorrhage, edema, mass effect, extra-axial fluid collection, hydrocephalus or mass lesion. Focal low-density within the inferior aspect of the left basal ganglia appears to represent a lacunar infarct. This is likely older to subacute in age based on appearance. Vascular: No hyperdense vessel or unexpected calcification. Skull: Normal. Negative for fracture or focal lesion. Sinuses/Orbits: No acute finding. Other: None. CT CERVICAL SPINE FINDINGS Alignment: No subluxation. Skull base and vertebrae: No acute fracture identified. Status post anterior cervical fusion at C5-6 and C6-7 with solid bony fusion present. Soft tissues and spinal canal: There is potentially soft tissue in the spinal canal at the level of the T1 and T2 levels. However, this may be artifactual based on streak artifact. Upper thoracic disc herniation cannot be entirely excluded. Disc levels:  See above. Upper chest: Negative. IMPRESSION: 1. 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 This appears likely older in age but could conceivably be somewhat subacute. 2. Status post ACDF at C5-6 and C6-7 with solid bony fusion present. 3. Possible soft tissue in the spinal canal at the level of T1 and T2. This is felt to potentially be artifactual by CT. Correlation suggested with neurologic exam. Upper thoracic disc herniation is not excluded and if there are any neurologic findings, MRI of the cervical and upper thoracic spine may be helpful. Electronically Signed   By: Irish Lack M.D.   On: 05/03/2021 15:32  ? ?CT Cervical Spine Wo Contrast ? ?Result Date: 05/03/2021 ?CLINICAL DATA:  Lightheadedness and weakness in right hand. EXAM: CT HEAD WITHOUT CONTRAST CT CERVICAL SPINE WITHOUT CONTRAST TECHNIQUE: Multidetector CT imaging of the head and cervical spine was performed following the standard protocol without intravenous contrast. Multiplanar CT image reconstructions of the cervical spine were also generated. RADIATION DOSE REDUCTION: This exam was performed according to the departmental dose-optimization program which includes automated exposure control, adjustment of the Adam and/or kV according to patient size and/or use of iterative reconstruction technique. COMPARISON:  Cervical spine radiographs on 05/11/2012 FINDINGS: CT HEAD FINDINGS Brain: The brain demonstrates no evidence of hemorrhage, edema, mass effect, extra-axial fluid collection, hydrocephalus or mass lesion. Focal low-density within the inferior aspect of the left basal ganglia appears to represent a lacunar infarct. This  is likely older to subacute in age based on appearance. Vascular: No hyperdense vessel or unexpected calcification. Skull: Normal. Negative for fracture or focal lesion. Sinuses/Orbits: No acute finding. Other: None. CT CERVICAL SPINE FINDINGS Alignment: No subluxation. Skull base and vertebrae: No acute fracture identified. Status post anterior cervical fusion at C5-6 and C6-7 with solid bony fusion present. Soft tissues and spinal canal: There is potentially soft tissue in the spinal canal at the level of the T1 and T2 levels. However, this may be artifactual based on streak artifact. Upper thoracic disc herniation cannot be entirely excluded. Disc levels:  See above. Upper chest: Negative. IMPRESSION: 1. 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 This appears likely older in age but could conceivably be somewhat subacute. 2. Status post ACDF at C5-6 and C6-7 with solid bony fusion  present. 3. Possible soft tissue in the spinal canal at the level of T1 and T2. This is felt to potentially be artifactual by CT. Correlation suggested with neurologic exam. Upper thoracic disc herniation is not ex

## 2021-05-03 NOTE — ED Triage Notes (Addendum)
Pt c/o weakness and tingling in hands bilat started at 1245 today. Pt states it has resolved. Pt states felt light headed., but denies any other sx. NIH 0, equal grip bilat. ?

## 2021-05-03 NOTE — Discharge Instructions (Signed)
Your head CT showed possible old stroke in your brain.  Please take a baby aspirin every day.  Call the neurologist to schedule an appointment for that finding and call Dr. Yetta Barre concerning the findings on your MRI of your cervical spine ?

## 2021-05-03 NOTE — ED Notes (Signed)
Patient transported to MRI 

## 2021-05-22 DIAGNOSIS — Z6830 Body mass index (BMI) 30.0-30.9, adult: Secondary | ICD-10-CM | POA: Diagnosis not present

## 2021-05-22 DIAGNOSIS — M542 Cervicalgia: Secondary | ICD-10-CM | POA: Diagnosis not present

## 2022-09-11 IMAGING — MR MR CERVICAL SPINE W/O CM
5 series · 35 of 48 positions shown · non-contrast
Comparison: Prior MRI from 12/09/2011.

CLINICAL DATA: Initial evaluation for myelopathy. Right hand
weakness.

EXAM:
MRI CERVICAL SPINE WITHOUT CONTRAST
TECHNIQUE: Multiplanar, multisequence MR imaging of the cervical spine was
performed. No intravenous contrast was administered.

[Series 16: T2 · sagittal · 3.0mm · 0.69mm/px · 6 of 15 slices shown (1 of 2)]
[im 1/15]
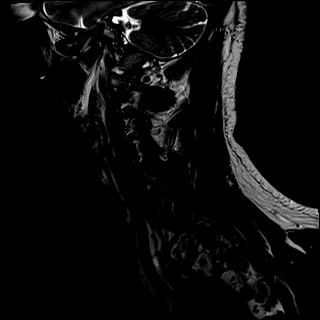
[im 3/15]
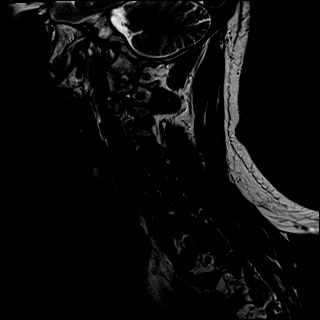
[im 6/15]
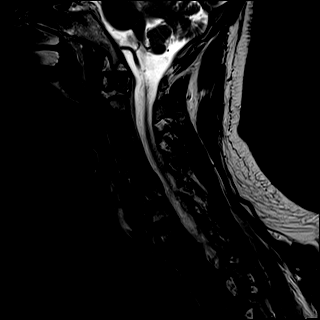
[im 9/15]
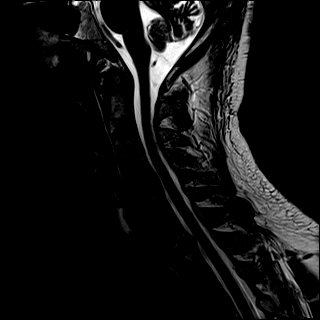
[im 12/15]
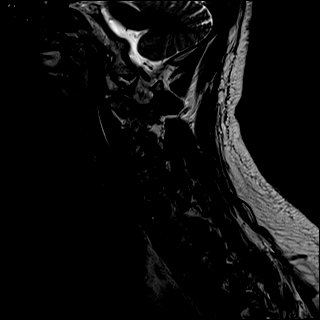
[im 15/15]
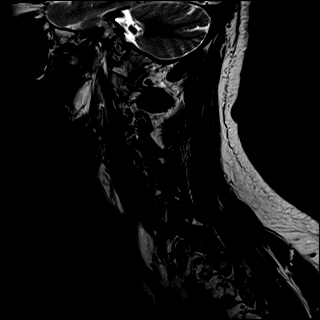

[Series 17: T1 · sagittal · 3.0mm · 0.69mm/px · 6 of 15 slices shown]
[im 1/15]
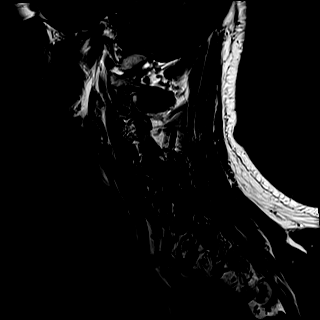
[im 3/15]
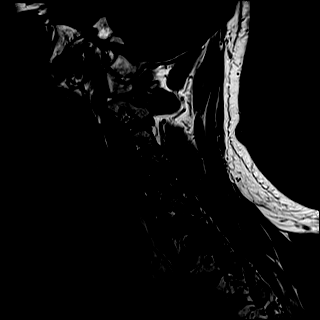
[im 6/15]
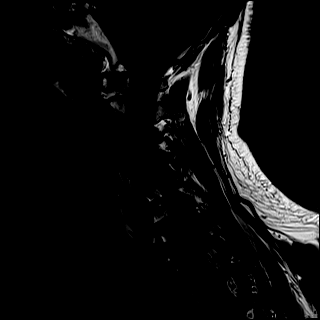
[im 9/15]
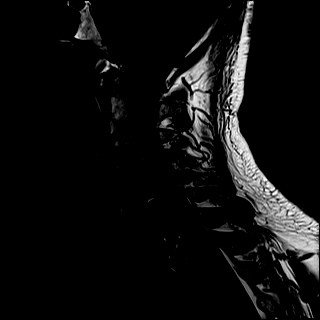
[im 12/15]
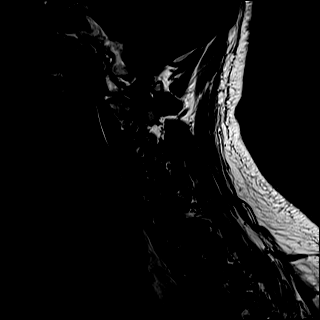
[im 15/15]
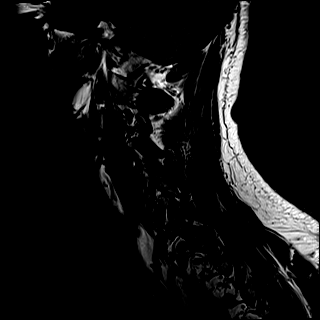

[Series 18: STIR · sagittal · 3.0mm · 0.86mm/px · 6 of 15 slices shown]
[im 1/15]
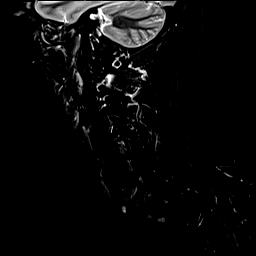
[im 3/15]
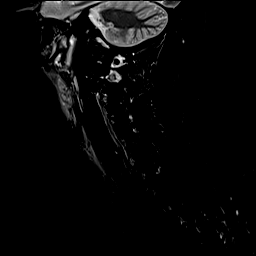
[im 6/15]
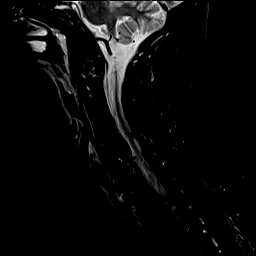
[im 9/15]
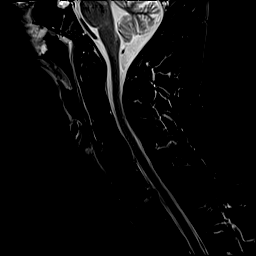
[im 12/15]
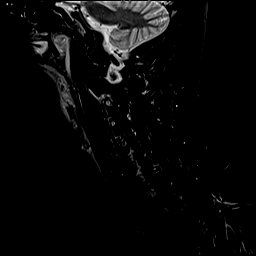
[im 15/15]
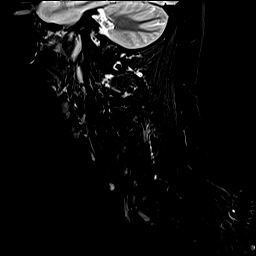

[Series 19: T2 · axial · 3.0mm · 0.66mm/px · z∈[-247,-125]mm · 9 of 39 slices shown (2 of 2)]
[im 1/39]
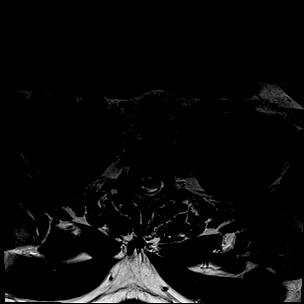
[im 6/39]
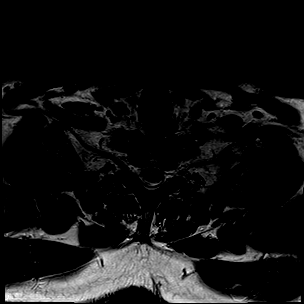
[im 11/39]
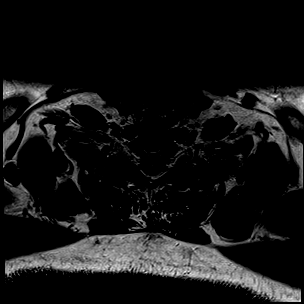
[im 17/39]
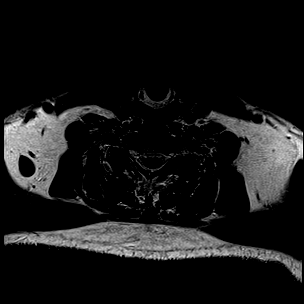
[im 20/39]
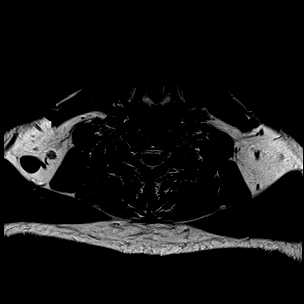
[im 22/39]
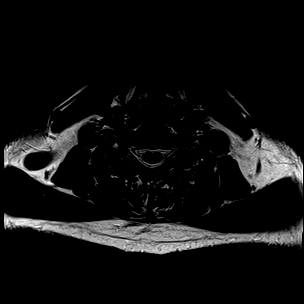
[im 28/39]
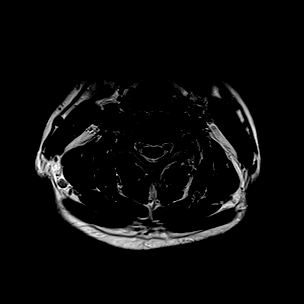
[im 33/39]
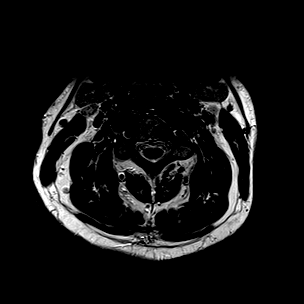
[im 39/39]
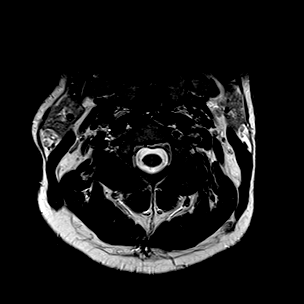

[Series 20: GRE · axial · 3.0mm · 0.39mm/px · z∈[-247,-125]mm · 8 of 40 slices shown]
[im 1/40]
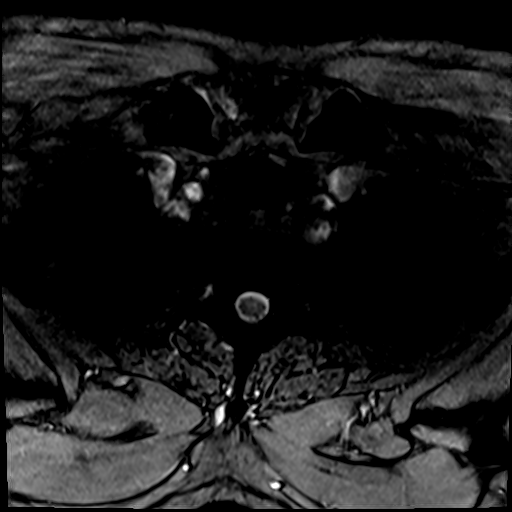
[im 6/40]
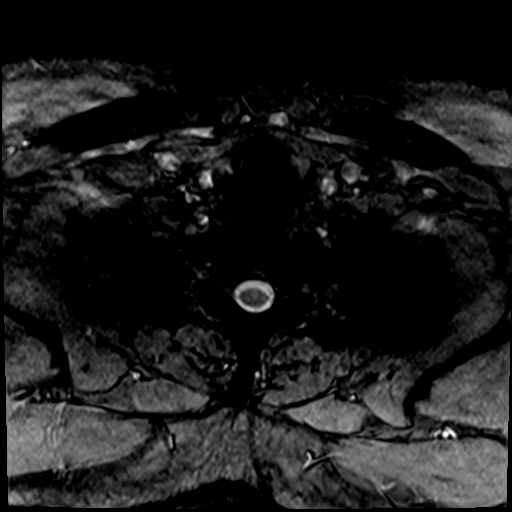
[im 12/40]
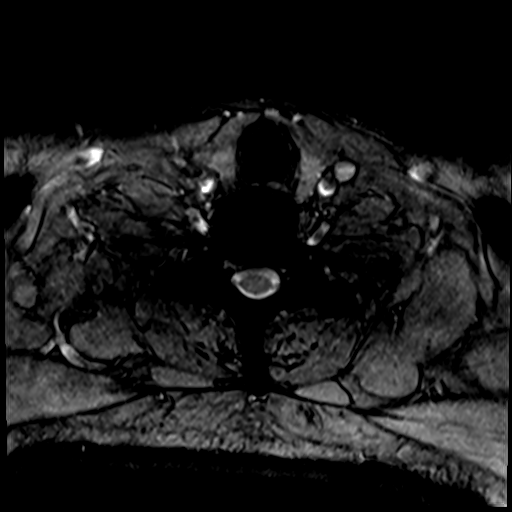
[im 17/40]
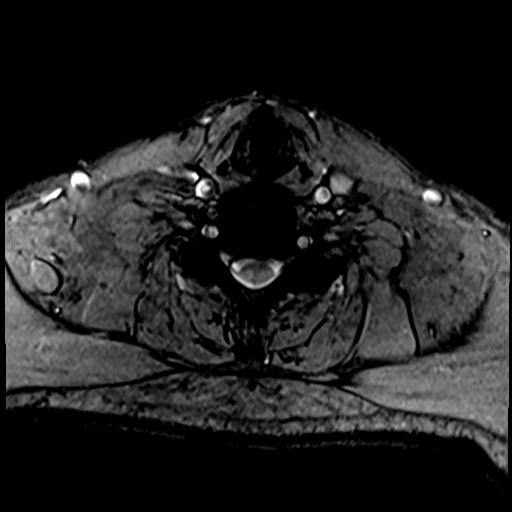
[im 23/40]
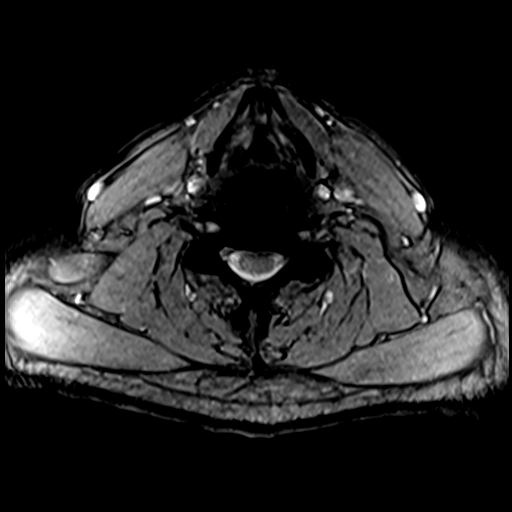
[im 28/40]
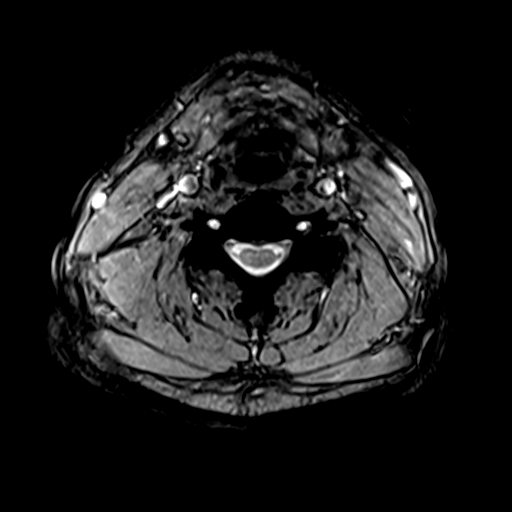
[im 34/40]
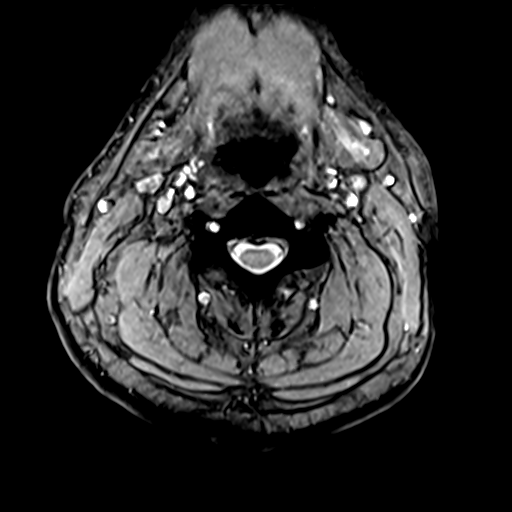
[im 40/40]
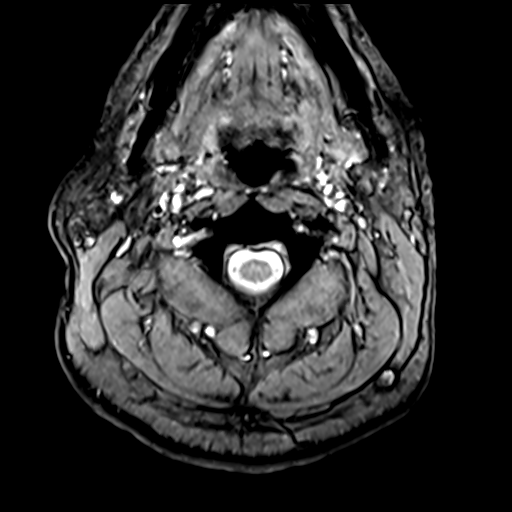

[35 of 48 positions shown; findings below may reference images not displayed]

FINDINGS: Alignment: Straightening of the normal cervical lordosis. Trace
anterolisthesis of C4 on C5.

Vertebrae: Postoperative changes from prior ACDF at C5-C7 with solid
arthrodesis. Vertebral body height maintained. Bone marrow signal
intensity normal. No discrete or worrisome osseous lesions or
abnormal marrow edema.

Cord: Normal signal morphology.

Posterior Fossa, vertebral arteries, paraspinal tissues:
Craniocervical junction normal. Paraspinous soft tissues within
normal limits. Normal flow voids seen within the vertebral arteries
bilaterally.

Disc levels:

C2-C3: Unremarkable.

C3-C4: Shallow central disc protrusion indents the ventral thecal
sac. No significant spinal stenosis. Mild uncovertebral hypertrophy
without significant foraminal encroachment.

C4-C5: Right paracentral disc protrusion indents the right ventral
thecal sac (series 20, image 16). Minimal flattening of the right
ventral cord without cord signal changes. Mild spinal stenosis.
Foramina remain patent.

C5-C6: Prior fusion. No residual spinal stenosis. Right-sided
uncovertebral spurring with residual mild to moderate right C6
foraminal narrowing. Left neural foramen remains patent.

C6-C7: Prior fusion. No residual spinal stenosis. Right-sided
uncovertebral spurring with residual mild to moderate right C7
foraminal narrowing. Left neural foramen is patent.

C7-T1: Disc bulge with endplate and uncovertebral spurring.
Superimposed right paracentral to foraminal disc protrusion (series
16, image 6). Mild flattening of the right ventral cord without cord
signal changes. Superimposed mild facet hypertrophy. No significant
spinal stenosis. Foramina remain patent.
IMPRESSION: 1. Prior ACDF at C5-C7 without residual spinal stenosis. Residual
mild to moderate right C6 and C7 foraminal narrowing related to
uncovertebral disease.
2. Right paracentral disc protrusion at C4-5 with mild flattening of
the right ventral cord and resultant mild spinal stenosis. The right
C5 nerve root could be affected.
3. Right paracentral to foraminal disc protrusion at C7-T1 with
resultant mild flattening of the right ventral cord. The exiting
right C8 nerve root could be affected.

## 2022-09-11 IMAGING — MR MR HEAD W/O CM
12 of 13 series · 44 of 48 positions shown · non-contrast
Comparison: Prior CT from 05/03/2021.

CLINICAL DATA: Initial evaluation for acute dizziness.

EXAM:
MRI HEAD WITHOUT CONTRAST
TECHNIQUE: Multiplanar, multiecho pulse sequences of the brain and surrounding
structures were obtained without intravenous contrast.

[Series 5: DWI · axial · 3.0mm · 0.88mm/px · z∈[-82,+71]mm · 8 of 104 slices shown (1 of 4)]
[im 1/104]
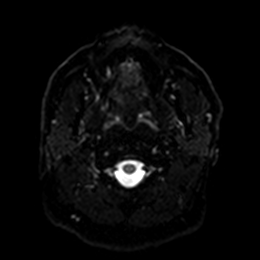
[im 15/104]
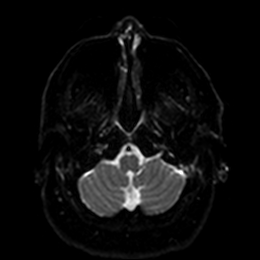
[im 30/104]
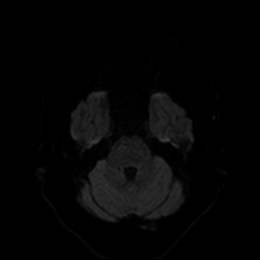
[im 45/104]
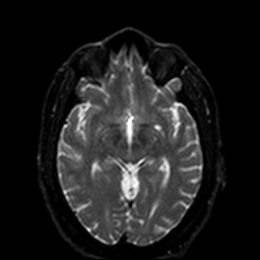
[im 59/104]
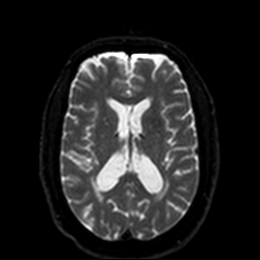
[im 74/104]
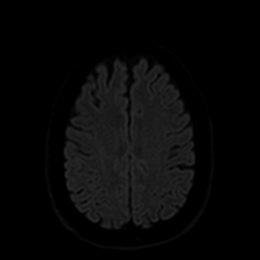
[im 89/104]
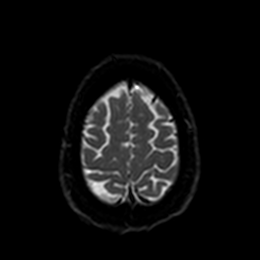
[im 104/104]
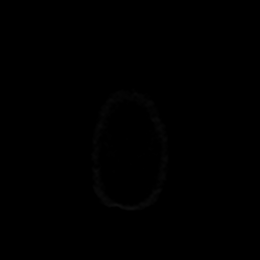

[Series 6: DWI · axial · 3.0mm · 0.88mm/px · z∈[-82,+71]mm · 4 of 52 slices shown (2 of 4)]
[im 1/52]
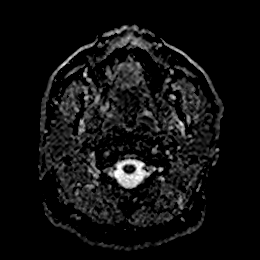
[im 18/52]
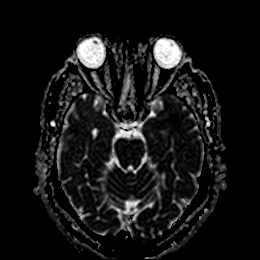
[im 35/52]
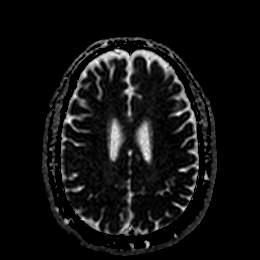
[im 52/52]
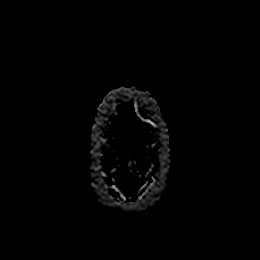

[Series 7: DWI · coronal · 4.0mm · 0.88mm/px · 5 of 74 slices shown (3 of 4)]
[im 1/74]
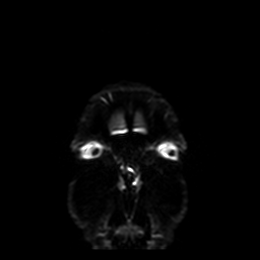
[im 19/74]
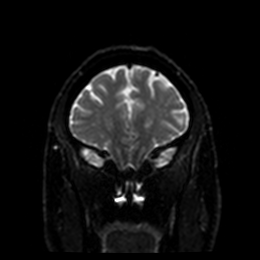
[im 37/74]
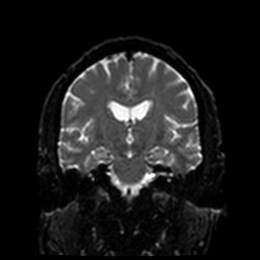
[im 55/74]
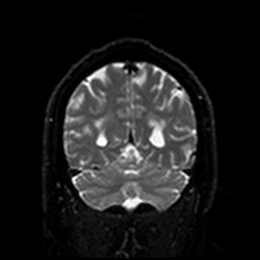
[im 74/74]
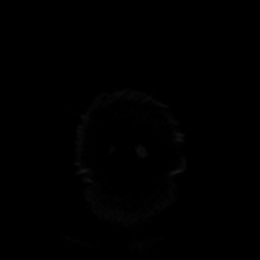

[Series 8: DWI · coronal · 4.0mm · 0.88mm/px · 3 of 37 slices shown (4 of 4)]
[im 1/37]
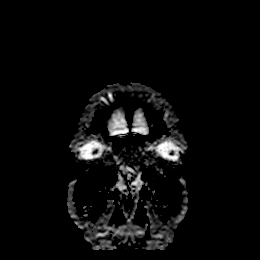
[im 19/37]
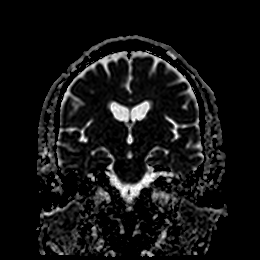
[im 37/37]
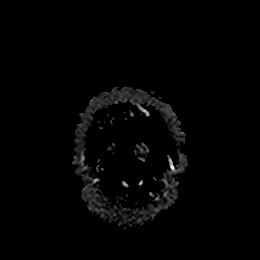

[Series 9: T1 · sagittal · 5.0mm · 0.75mm/px · 2 of 25 slices shown]
[im 1/25]
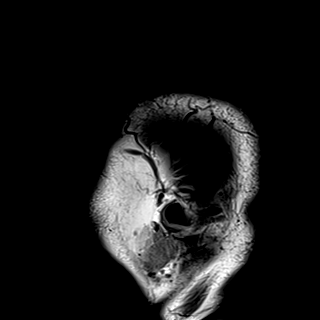
[im 25/25]
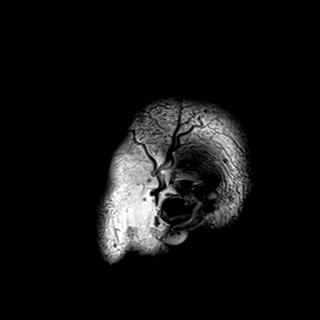

[Series 10: T2 · axial · 5.0mm · 0.72mm/px · z∈[-84,+72]mm · 2 of 27 slices shown (1 of 2)]
[im 1/27]
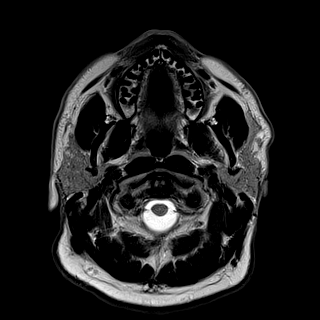
[im 27/27]
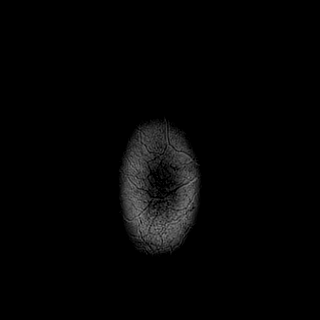

[Series 11: FLAIR · axial · 5.0mm · 0.45mm/px · z∈[-83,+72]mm · 2 of 27 slices shown]
[im 1/27]
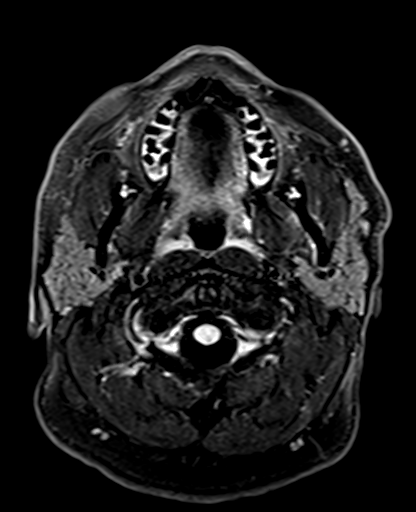
[im 27/27]
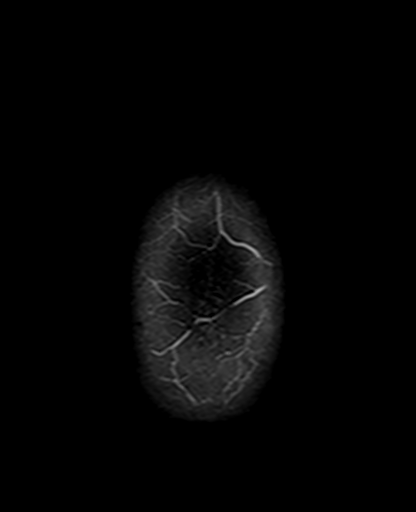

[Series 12: mag_images · axial · 3.0mm · 0.90mm/px · z∈[-88,+77]mm · 4 of 56 slices shown]
[im 1/56]
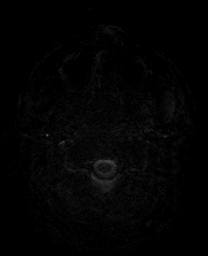
[im 19/56]
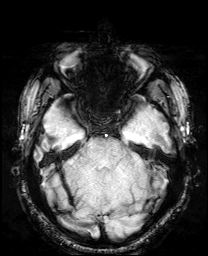
[im 37/56]
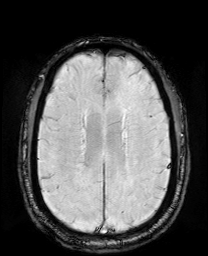
[im 56/56]
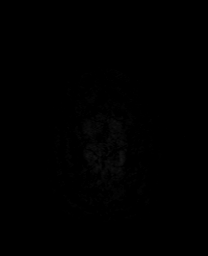

[Series 13: pha_images · axial · 3.0mm · 0.90mm/px · z∈[-88,+77]mm · 4 of 56 slices shown]
[im 1/56]
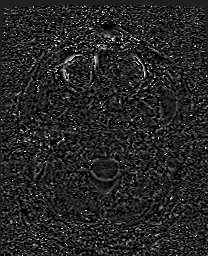
[im 19/56]
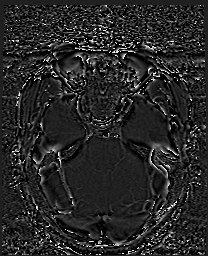
[im 37/56]
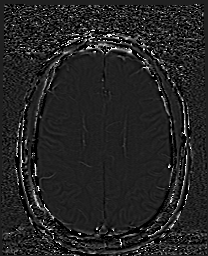
[im 56/56]
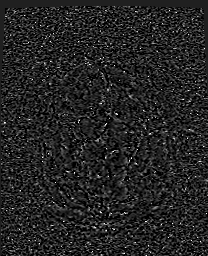

[Series 14: swi_images · axial · 3.0mm · 0.90mm/px · z∈[-88,+77]mm · 4 of 56 slices shown]
[im 1/56]
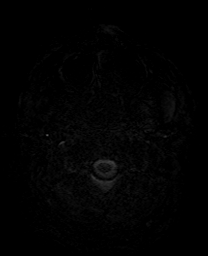
[im 19/56]
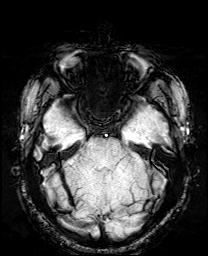
[im 37/56]
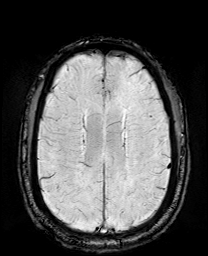
[im 56/56]
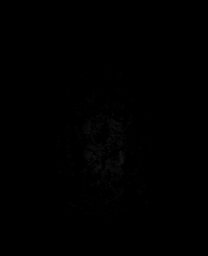

[Series 15: mip_images(sw) · axial · 24.0mm · 0.90mm/px · z∈[-77,+66]mm · 4 of 49 slices shown]
[im 1/49]
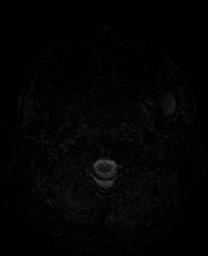
[im 17/49]
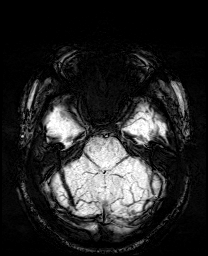
[im 33/49]
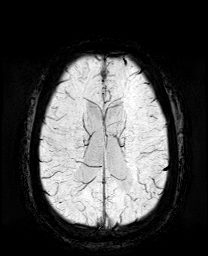
[im 49/49]
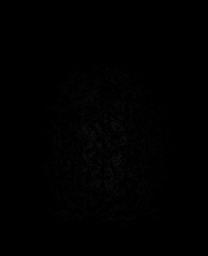

[Series 17: T2 · coronal · 5.0mm · 0.34mm/px · 2 of 30 slices shown (2 of 2)]
[im 1/30]
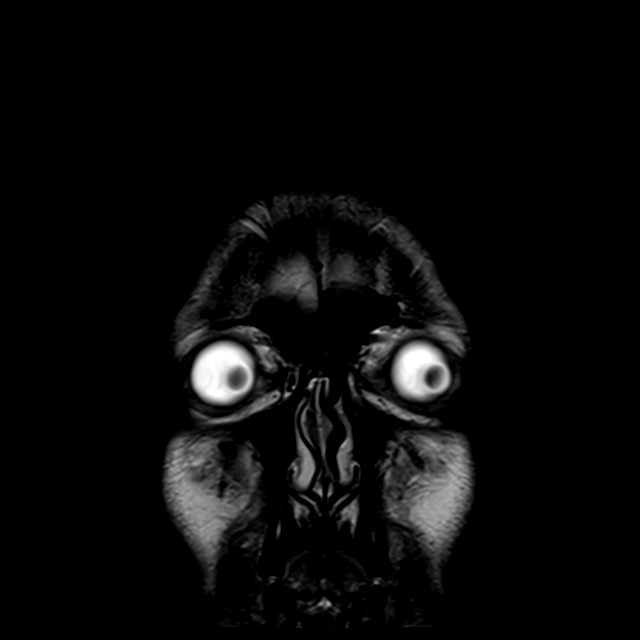
[im 30/30]
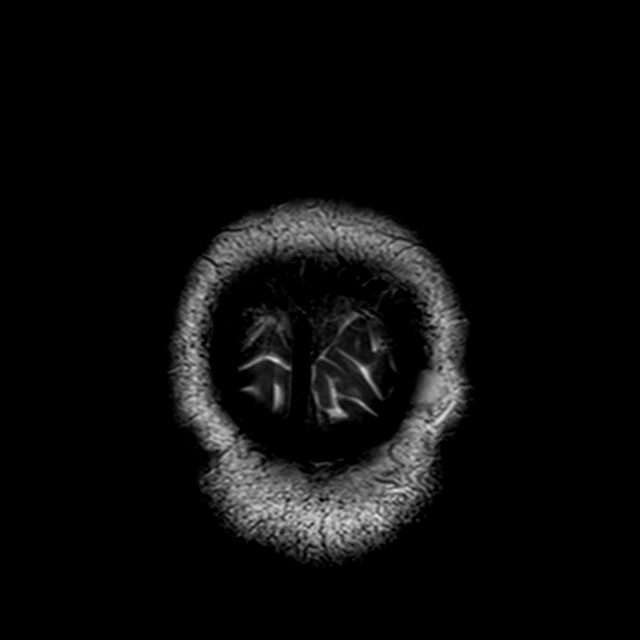

[44 of 48 positions shown; findings below may reference images not displayed]

FINDINGS: Brain: Cerebral volume within normal limits. Mild chronic
microvascular ischemic disease for age. No evidence for acute or
subacute ischemia. Gray-white matter differentiation maintained. No
areas of chronic cortical infarction. No acute or chronic
intracranial hemorrhage.

No mass lesion, midline shift or mass effect no hydrocephalus or
extra-axial fluid collection. Pituitary gland suprasellar region
normal.

Vascular: Major intracranial vascular flow voids are maintained.

Skull and upper cervical spine: Craniocervical junction within
normal limits. Bone marrow signal intensity normal. No scalp soft
tissue abnormality.

Sinuses/Orbits: Left gaze noted. Globes are soft tissues demonstrate
no other acute finding. Paranasal sinuses are clear. Moderate
bilateral mastoid effusions noted.

Other: None.
IMPRESSION: 1. No acute intracranial abnormality.
2. Mild chronic microvascular ischemic disease for age.
3. Moderate bilateral mastoid effusions.

## 2022-09-11 IMAGING — CT CT HEAD W/O CM
4 of 5 series · 14 of 47 positions shown, 16 images · non-contrast
Comparison: Cervical spine radiographs on 05/11/2012

CLINICAL DATA: Lightheadedness and weakness in right hand.



[Series 3: head without · axial · non-contrast · 0.44mm/px · z∈[+1316,+1430]mm · 5 of 35 slices shown, 7 images]
[im 6/35  brain]
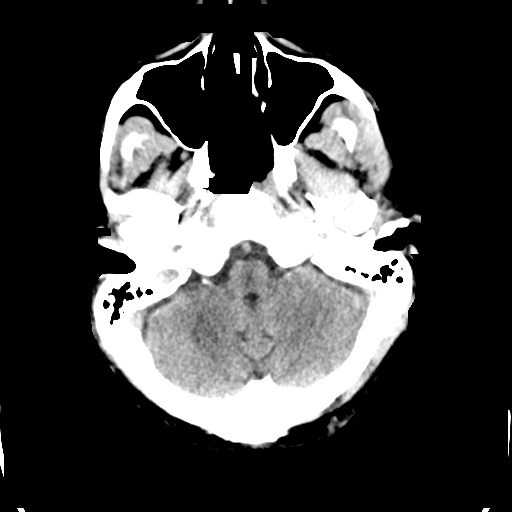
[im 6/35  bone]
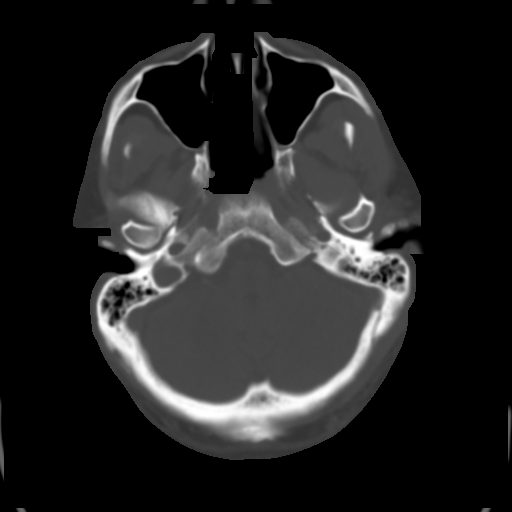
[im 12/35  brain]
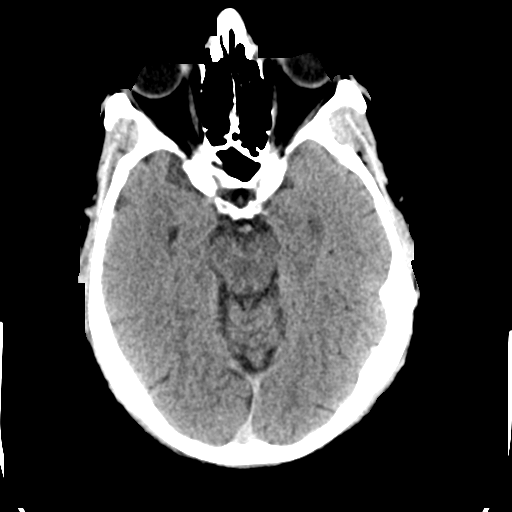
[im 18/35  brain]
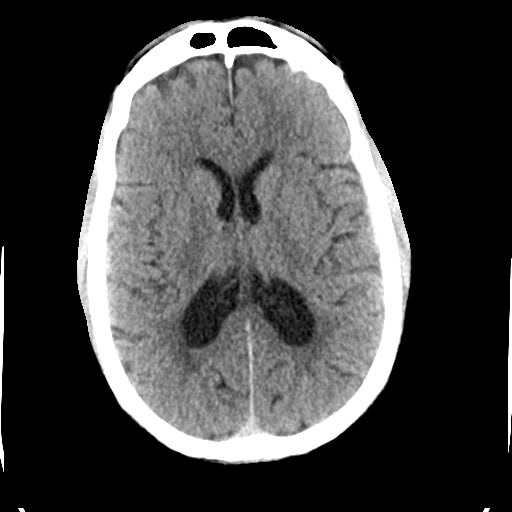
[im 23/35  brain]
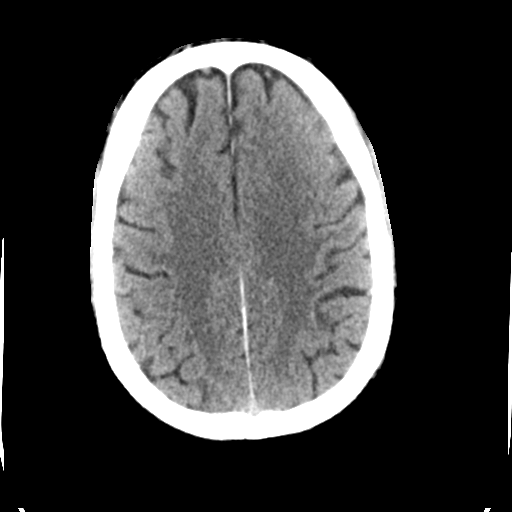
[im 29/35  brain]
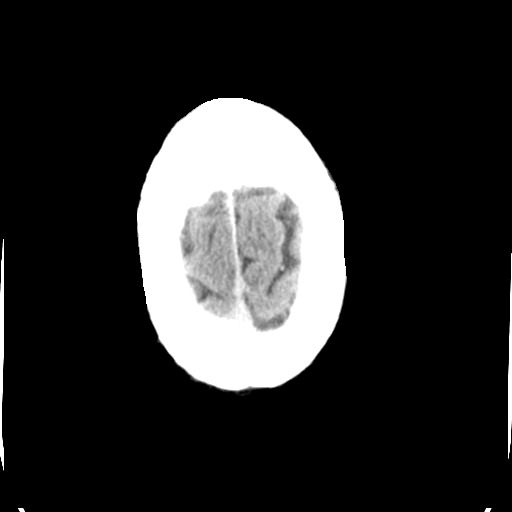
[im 29/35  bone]
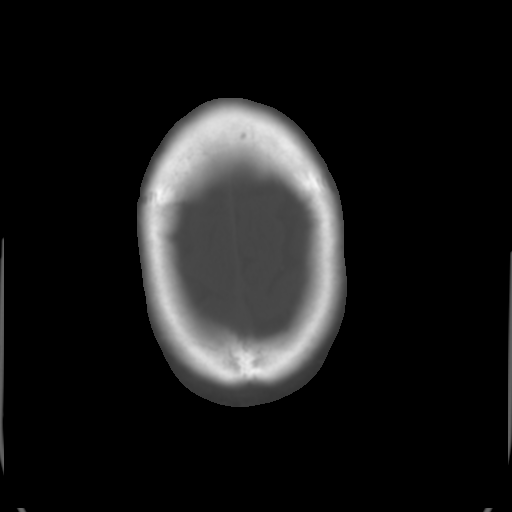

[Series 5: head without cor · coronal · non-contrast · 0.35mm/px · 3 of 75 slices shown]
[im 25/75  brain]
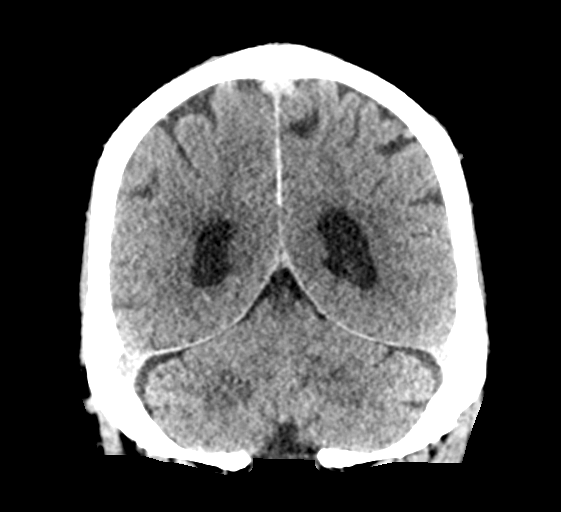
[im 33/75  brain]
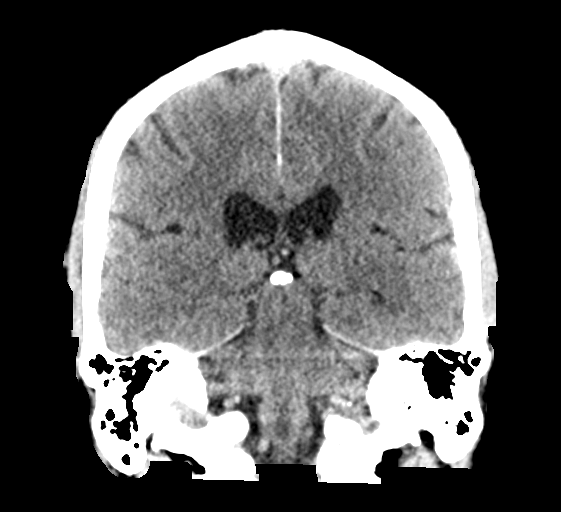
[im 42/75  brain]
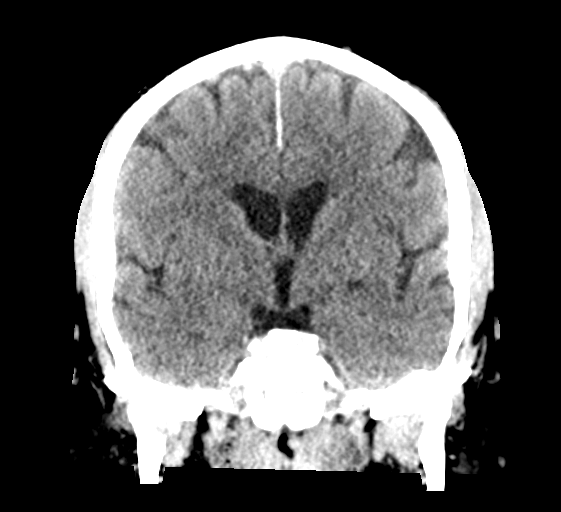

[Series 6: head without sag · sagittal · non-contrast · 0.34mm/px · 3 of 62 slices shown]
[im 21/62  brain]
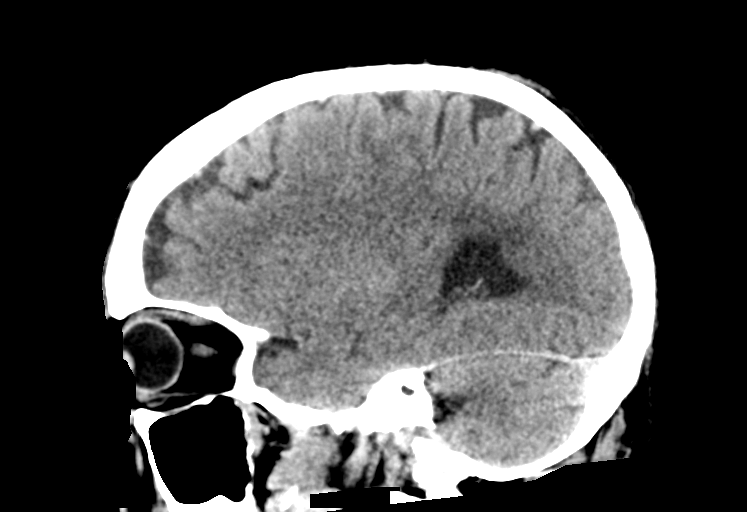
[im 31/62  brain]
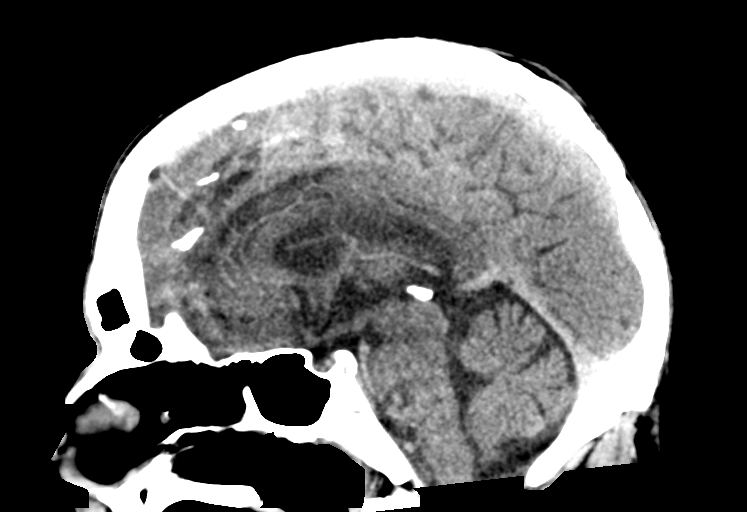
[im 41/62  brain]
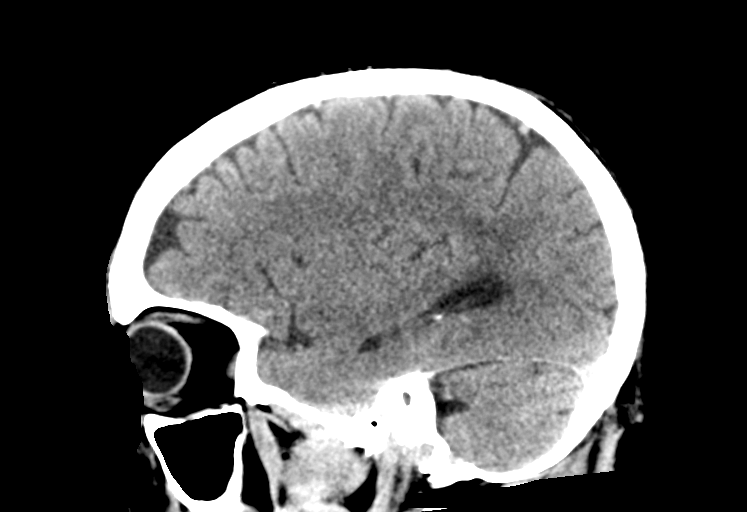

[Series 7: true axial · axial · 0.39mm/px · z∈[+1344,+1408]mm · 3 of 33 slices shown]
[im 7/33  brain]
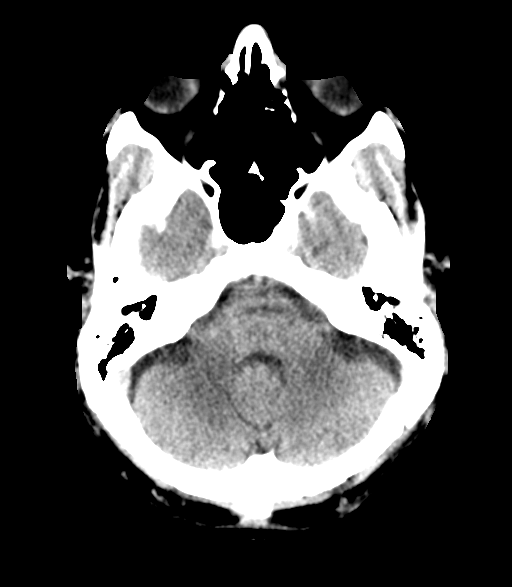
[im 13/33  brain]
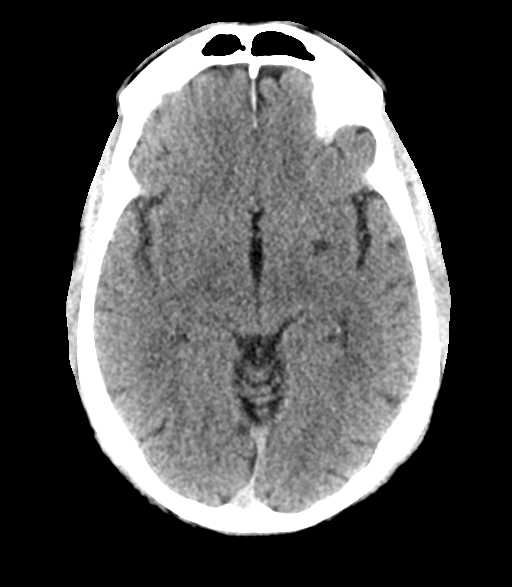
[im 20/33  brain]
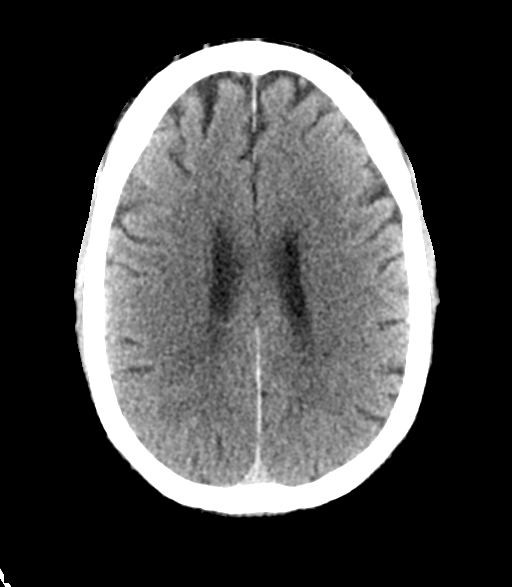

[14 of 47 positions shown; findings below may reference images not displayed]

FINDINGS: CT HEAD FINDINGS

Brain: The brain demonstrates no evidence of hemorrhage, edema, mass
effect, extra-axial fluid collection, hydrocephalus or mass lesion.
Focal low-density within the inferior aspect of the left basal
ganglia appears to represent a lacunar infarct. This is likely older
to subacute in age based on appearance.

Vascular: No hyperdense vessel or unexpected calcification.

Skull: Normal. Negative for fracture or focal lesion.

Sinuses/Orbits: No acute finding.

Other: None.

CT CERVICAL SPINE FINDINGS

Alignment: No subluxation.

Skull base and vertebrae: No acute fracture identified. Status post
anterior cervical fusion at C5-6 and C6-7 with solid bony fusion
present.

Soft tissues and spinal canal: There is potentially soft tissue in
the spinal canal at the level of the T1 and T2 levels. However, this
may be artifactual based on streak artifact. Upper thoracic disc
herniation cannot be entirely excluded.

Disc levels:  See above.

Upper chest: Negative.
IMPRESSION: 1. Lacunar infarct of the inferior left basal ganglia. This appears
likely older in age but could conceivably be somewhat subacute.
2. Status post ACDF at C5-6 and C6-7 with solid bony fusion present.
3. Possible soft tissue in the spinal canal at the level of T1 and
T2. This is felt to potentially be artifactual by CT. Correlation
suggested with neurologic exam. Upper thoracic disc herniation is
not excluded and if there are any neurologic findings, MRI of the
cervical and upper thoracic spine may be helpful.
